# Patient Record
Sex: Female | Born: 1950 | Race: White | Hispanic: No | Marital: Married | Smoking: Never smoker
Health system: Southern US, Community
[De-identification: ages and names within clinical notes are randomized; demographics above are authoritative.]

## PROBLEM LIST (undated history)

## (undated) DIAGNOSIS — K219 Gastro-esophageal reflux disease without esophagitis: Secondary | ICD-10-CM

## (undated) DIAGNOSIS — F419 Anxiety disorder, unspecified: Secondary | ICD-10-CM

## (undated) DIAGNOSIS — E7800 Pure hypercholesterolemia, unspecified: Secondary | ICD-10-CM

## (undated) DIAGNOSIS — K589 Irritable bowel syndrome without diarrhea: Secondary | ICD-10-CM

## (undated) DIAGNOSIS — G4700 Insomnia, unspecified: Secondary | ICD-10-CM

## (undated) HISTORY — DX: Anxiety disorder, unspecified: F41.9

## (undated) HISTORY — PX: CHOLECYSTECTOMY: SHX55

## (undated) HISTORY — DX: Gastro-esophageal reflux disease without esophagitis: K21.9

## (undated) HISTORY — PX: CATARACT EXTRACTION: SUR2

## (undated) HISTORY — DX: Irritable bowel syndrome, unspecified: K58.9

## (undated) HISTORY — DX: Pure hypercholesterolemia, unspecified: E78.00

## (undated) HISTORY — DX: Insomnia, unspecified: G47.00

---

## 2017-12-13 ENCOUNTER — Encounter: Payer: Self-pay | Admitting: Neurology

## 2017-12-13 ENCOUNTER — Ambulatory Visit: Payer: BC Managed Care – PPO | Admitting: Neurology

## 2017-12-13 VITALS — BP 123/78 | HR 77 | Wt 115.5 lb

## 2017-12-13 DIAGNOSIS — R251 Tremor, unspecified: Secondary | ICD-10-CM

## 2017-12-13 DIAGNOSIS — G20 Parkinson's disease: Secondary | ICD-10-CM | POA: Diagnosis not present

## 2017-12-13 DIAGNOSIS — R29818 Other symptoms and signs involving the nervous system: Secondary | ICD-10-CM

## 2017-12-13 DIAGNOSIS — R29898 Other symptoms and signs involving the musculoskeletal system: Secondary | ICD-10-CM

## 2017-12-13 DIAGNOSIS — Z820 Family history of epilepsy and other diseases of the nervous system: Secondary | ICD-10-CM

## 2017-12-13 NOTE — Progress Notes (Signed)
Subjective:    Patient ID: Paula Fuller is a 67 y.o. female.  HPI     Paula Foley, MD, PhD [facility] [address] [address] [place], [place] [address]  Dear Paula Fuller,  I saw your patient, Paula Fuller, upon your kind request in my neurologic clinic today for initial consultation of her tremor. The patient is unaccompanied today. As you know, [name] is a 67 year old right-handed woman with an underlying medical history of anxiety, depression, irritable bowel syndrome, recent loss of weight, prior history of hyperlipidemia and reflux disease, who reports a right more than left hand tremor for the past 2 years, worse in the past few months. She has noticed other issues including fine motor dyscontrol, balance issues, mild short-term memory loss. 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 She has never considered seeing ENT for this. She has suboptimally controlled anxiety. She has been on Lexapro in the past which caused significant GI related side effects and for the past 2 months she has been on sertraline. She does not feel that the sertraline has been helpful. She has been on Xanax off and on since the late 90s when she had to take care of her father. She has been on Xanax consistently 3 times a day for the past couple of years, since 2016 or so. She is currently on sertraline 100 mg daily and alprazolam 0.5 mg 3 times a day. She has had significant weight loss and had multiple tests for this including endoscopy and CAT scan all per her report without significant abnormality is thankfully. She is using some protein milkshakes to supplement her diet. Her tremor is at rest as well as with activity at times. It is more noticeable on the right, she has also noticed difficulty with fine motor tasks also more on the right. She fell recently once in the yard, as she was standing up from the squatting position and thankfully did not hurt herself.I  reviewed your office note from 10/29/2017. She has no family history of tremors but reports a family history of Parkinson's disease, her father's first cousin apparently had Parkinson's disease, another first cousin of her father had some other neurological disease, maybe MS. She has worried about MS. She saw a neurologist in [place], [place] in August 2018 told her she does not have MS. She is a nonsmoker and does not drink alcohol and drinks caffeine one serving per day on average. She is married and lives with her husband, they have 1 daughter. 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  Her Past Medical History Is Significant For: Past Medical History:  Diagnosis Date  . Anxiety disorder   . GERD (gastroesophageal reflux disease)   . High cholesterol   . IBS (irritable bowel syndrome)   . Insomnia     Her Past Surgical History Is Significant For:   Her Family History Is Significant For: No family history on file.  Her Social History Is Significant For: Social History   Socioeconomic History  . Marital status: Married    Spouse name: Not on file  . Number of children: Not on file  . Years of education: Not on file  . Highest education level: Not on file  Occupational History  . Not on file  Social Needs  . Financial resource strain: Not on file  . Food insecurity:    Worry:  Not on file    Inability: Not on file  . Transportation needs:    Medical: Not on file    Non-medical: Not on file  Tobacco Use  . Smoking status: Never Smoker  . Smokeless tobacco: Never Used  Substance and Sexual Activity  . Alcohol use: Never    Frequency: Never  . Drug use: Never  . Sexual activity: Not on file  Lifestyle  . Physical activity:    Days per week: Not on file    Minutes per session: Not on file  . Stress: Not on file  Relationships  . Social connections:    Talks on phone: Not on file    Gets together: Not on  file    Attends religious service: Not on file    Active member of club or organization: Not on file    Attends meetings of clubs or organizations: Not on file    Relationship status: Not on file  Other Topics Concern  . Not on file  Social History Narrative  . Not on file    Her Allergies Are:  Not on File:   Her Current Medications Are:  Outpatient Encounter Medications as of 12/13/2017  Medication Sig  . ALPRAZolam (XANAX) 0.5 MG tablet Take 0.5 mg by mouth 3 (three) times daily.  . sertraline (ZOLOFT) 50 MG tablet Take 50 mg by mouth daily.  . [DISCONTINUED] escitalopram (LEXAPRO) 10 MG tablet Take 10 mg by mouth daily.  . [DISCONTINUED] estradiol (ESTRACE) 0.1 MG/GM vaginal cream Place 1 Applicatorful vaginally 3 (three) times a week.   No facility-administered encounter medications on file as of 12/13/2017.   : Review of Systems:  Out of a complete 14 point review of systems, all are reviewed and negative with the exception of these symptoms as listed below:  Review of Systems  Neurological:       Patient states that she has frequent headaches, tremors-mostly on her right side, and fatigue for about 2 years now.     Objective:  Neurological Exam  Physical Exam Physical Examination:   Vitals:   12/13/17 0942  BP: 123/78  Pulse: 77   General Examination: The patient is a very pleasant 67 y.o. female in no acute distress. She appears well-developed and well-nourished and well groomed. She is mildly anxious appearing.  HEENT: Normocephalic, atraumatic, pupils are equal, round and reactive to light and accommodation. She has corrective eyeglasses, extraocular tracking is fairly well-preserved but she does have very mild facial masking. She has an intermittent lower lip tremor. Airway examination shows benign findings with the exception of mild to moderate mouth dryness. Tongue protrudes centrally and palate elevates symmetrically. She has no telltale hypophonia, no  dysarthria, no voice tremor.  Chest: Clear to auscultation without wheezing, rhonchi or crackles noted.  Heart: S1+S2+0, regular and normal without murmurs, rubs or gallops noted.   Abdomen: Soft, non-tender and non-distended with normal bowel sounds appreciated on auscultation.  Extremities: There is no pitting edema in the distal lower extremities bilaterally. Pedal pulses are intact.  Skin: Warm and dry without trophic changes noted. There are no varicose veins.  Musculoskeletal: exam reveals no obvious joint deformities, tenderness or joint swelling or erythema.   Neurologically:  Mental status: The patient is awake, alert and oriented in all 4 spheres. Her immediate and remote memory, attention, language skills and fund of knowledge are appropriate. There is no evidence of aphasia, agnosia, apraxia or anomia. Speech is clear with normal prosody and enunciation. Thought  process is linear. Mood is normal and affect is normal.  Cranial nerves II - XII are as described above under HEENT exam. In addition: shoulder shrug is normal. Motor exam: Normal bulk, and strength are noted. She has mild increase in tone in the right upper extremity with slight cogwheeling noted. There is a mild intermittent resting tremor in the right hand.   On 12/13/2017: On Archimedes spiral drawing she has slight insecurity with both hands, no obvious tremor though. Handwriting is legible, not particularly tremulous, but mildly micrographic. She has a slight postural tremor in both upper extremities, she has no significant action tremor, no intention tremor. On fine motor testing with finger taps she has mild difficulty on the right, slight difficulty on the left, mild fatiguing/decrease in amplitude noted on the right, similarly with foot taps she has mild difficulty on the right, slight difficulty in the left. Rapid alternating patting is fairly well-preserved bilaterally.  Romberg is negative. Reflexes are 3+  throughout. Babinski: Toes are flexor bilaterally.  Cerebellar testing: No dysmetria or intention tremor on finger to nose testing. There is no truncal or gait ataxia.  Sensory exam: intact to light touch.  Gait, station and balance: She stands without difficulty, posture is age-appropriate,left shoulder perhaps slightly higher than right. She walks with a normal stride length and fairly good pace, slight decrease in arm swing noted on the right. She turns well, balance is preserved.   Assessment and Plan:   In summary, Tammatha Cobb is a very pleasant 67 year old female ith an underlying medical history of anxiety, depression, irritable bowel syndrome, recent loss of weight, prior history of hyperlipidemia and reflux disease, who presents for neurologic consultation of her tremor of approximately 2 years duration, right side more than left. In addition, she has noted other symptoms including fine motor dysfunction, changes in her balance. Her anxiety is suboptimally controlled. She is not sure that the sertraline has been helpful. She is not very open to the possibility of seeing a psychiatrist or psychologist but is encouraged to discuss this with you. 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 She does have some complicating factors in that she is taking a medication that can exacerbate tremors, she is quite anxious which can increase tremor. She gives a remote family history of parkinsonism or Parkinson's disease in a distant uncle. I did not suggest any new medications at this time and would like to follow her clinically. I would like to proceed with a brain MRI at this point. I talked her about potentially pursuing a DaT scan for help with diagnosing her tremor disorder. We may Consider symptomatic medication soon.we talked about the importance of a healthy lifestyle in general. I  encouraged the patient to eat healthy, exercise daily and keep well hydrated, to keep a scheduled bedtime and wake time routine, to not skip any meals and eat healthy snacks in between meals and to have protein with every meal. In particular, I stressed the importance of regular exercise, within of course the patient's own mobility limitations. I answered all her questions today and the patient was in agreement with the above outlined plan. I would like to see the patient back in 3-4 months, sooner if needed, we will keep you posted as to her MRI results in the interim. Thank you very much for allowing me to participate in the care of this nice patient. If I can be of any further  assistance to you please do not hesitate to call me at [phone].  Sincerely,   Star Age, MD, PhD

## 2017-12-13 NOTE — Progress Notes (Deleted)
Subjective:    Patient ID: Paula Fuller is a 67 y.o. female.  HPI     Huston Foley, MD, PhD [facility] [address], [address] [address] [place], [place] [address]  Dear Paula Fuller,  I saw your patient, Paula Fuller, upon your kind request in my neurologic clinic today for initial consultation of her tremor. The patient is unaccompanied today. As you know, Paula Fuller is a 67 year old right-handed woman with an underlying medical history of anxiety, depression, irritable bowel syndrome, recent loss of weight, prior history of hyperlipidemia and reflux disease, who reports a right more than left hand tremor for the past 2 years, worse in the past few months. She has noticed other issues including fine motor dyscontrol, balance issues, mild short-term memory loss. 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 She has never considered seeing ENT for this. She has suboptimally controlled anxiety. She has been on Lexapro in the past which caused significant GI related side effects and for the past 2 months she has been on sertraline. She does not feel that the sertraline has been helpful. She has been on Xanax off and on since the late 90s when she had to take care of her father. She has been on Xanax consistently 3 times a day for the past couple of years, since 2016 or so. She is currently on sertraline 100 mg daily and alprazolam 0.5 mg 3 times a day. She has had significant weight loss and had multiple tests for this including endoscopy and CAT scan all per her report without significant abnormality is thankfully. She is using some protein milkshakes to supplement her diet. Her tremor is at rest as well as with activity at times. It is more noticeable on the right, she has also noticed difficulty with fine motor tasks also more on the right. She fell recently once in the yard, as she was standing up from the squatting position and thankfully did not hurt herself.I  reviewed your office note from 10/29/2017. She has no family history of tremors but reports a family history of Parkinson's disease, her father's first cousin apparently had Parkinson's disease, another first cousin of her father had some other neurological disease, maybe MS. She has worried about MS. She saw a neurologist in [place], [place] in August 2018 told her she does not have MS. She is a nonsmoker and does not drink alcohol and drinks caffeine one serving per day on average. She is married and lives with her husband, they have 1 daughter. 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  Her Past Medical History Is Significant For: Past Medical History:  Diagnosis Date  . Anxiety disorder   . GERD (gastroesophageal reflux disease)   . High cholesterol   . IBS (irritable bowel syndrome)   . Insomnia     Her Past Surgical History Is Significant For:   Her Family History Is Significant For: No family history on file.  Her Social History Is Significant For: Social History   Socioeconomic History  . Marital status: Married    Spouse name: Not on file  . Number of children: Not on file  . Years of education: Not on file  . Highest education level: Not on file  Occupational History  . Not on file  Social Needs  . Financial resource strain: Not on file  . Food insecurity:    Worry:  Not on file    Inability: Not on file  . Transportation needs:    Medical: Not on file    Non-medical: Not on file  Tobacco Use  . Smoking status: Never Smoker  . Smokeless tobacco: Never Used  Substance and Sexual Activity  . Alcohol use: Never    Frequency: Never  . Drug use: Never  . Sexual activity: Not on file  Lifestyle  . Physical activity:    Days per week: Not on file    Minutes per session: Not on file  . Stress: Not on file  Relationships  . Social connections:    Talks on phone: Not on file    Gets together: Not on  file    Attends religious service: Not on file    Active member of club or organization: Not on file    Attends meetings of clubs or organizations: Not on file    Relationship status: Not on file  Other Topics Concern  . Not on file  Social History Narrative  . Not on file    Her Allergies Are:  Not on File:   Her Current Medications Are:  Outpatient Encounter Medications as of 12/13/2017  Medication Sig  . ALPRAZolam (XANAX) 0.5 MG tablet Take 0.5 mg by mouth 3 (three) times daily.  . sertraline (ZOLOFT) 50 MG tablet Take 50 mg by mouth daily.  . [DISCONTINUED] escitalopram (LEXAPRO) 10 MG tablet Take 10 mg by mouth daily.  . [DISCONTINUED] estradiol (ESTRACE) 0.1 MG/GM vaginal cream Place 1 Applicatorful vaginally 3 (three) times a week.   No facility-administered encounter medications on file as of 12/13/2017.   : Review of Systems:  Out of a complete 14 point review of systems, all are reviewed and negative with the exception of these symptoms as listed below:  Review of Systems  Neurological:       Patient states that she has frequent headaches, tremors-mostly on her right side, and fatigue for about 2 years now.     Objective:  Neurological Exam  Physical Exam Physical Examination:   Vitals:   12/13/17 0942  BP: 123/78  Pulse: 77   General Examination: The patient is a very pleasant 67 y.o. female in no acute distress. She appears well-developed and well-nourished and well groomed. She is mildly anxious appearing.  HEENT: Normocephalic, atraumatic, pupils are equal, round and reactive to light and accommodation. She has corrective eyeglasses, extraocular tracking is fairly well-preserved but she does have very mild facial masking. She has an intermittent lower lip tremor. Airway examination shows benign findings with the exception of mild to moderate mouth dryness. Tongue protrudes centrally and palate elevates symmetrically. She has no telltale hypophonia, no  dysarthria, no voice tremor.  Chest: Clear to auscultation without wheezing, rhonchi or crackles noted.  Heart: S1+S2+0, regular and normal without murmurs, rubs or gallops noted.   Abdomen: Soft, non-tender and non-distended with normal bowel sounds appreciated on auscultation.  Extremities: There is no pitting edema in the distal lower extremities bilaterally. Pedal pulses are intact.  Skin: Warm and dry without trophic changes noted. There are no varicose veins.  Musculoskeletal: exam reveals no obvious joint deformities, tenderness or joint swelling or erythema.   Neurologically:  Mental status: The patient is awake, alert and oriented in all 4 spheres. Her immediate and remote memory, attention, language skills and fund of knowledge are appropriate. There is no evidence of aphasia, agnosia, apraxia or anomia. Speech is clear with normal prosody and enunciation. Thought  process is linear. Mood is normal and affect is normal.  Cranial nerves II - XII are as described above under HEENT exam. In addition: shoulder shrug is normal. Motor exam: Normal bulk, and strength are noted. She has mild increase in tone in the right upper extremity with slight cogwheeling noted. There is a mild intermittent resting tremor in the right hand.   On 12/13/2017: On Archimedes spiral drawing she has slight insecurity with both hands, no obvious tremor though. Handwriting is legible, not particularly tremulous, but mildly micrographic. She has a slight postural tremor in both upper extremities, she has no significant action tremor, no intention tremor. On fine motor testing with finger taps she has mild difficulty on the right, slight difficulty on the left, mild fatiguing/decrease in amplitude noted on the right, similarly with foot taps she has mild difficulty on the right, slight difficulty in the left. Rapid alternating patting is fairly well-preserved bilaterally.  Romberg is negative. Reflexes are 3+  throughout. Babinski: Toes are flexor bilaterally.  Cerebellar testing: No dysmetria or intention tremor on finger to nose testing. There is no truncal or gait ataxia.  Sensory exam: intact to light touch.  Gait, station and balance: She stands without difficulty, posture is age-appropriate,left shoulder perhaps slightly higher than right. She walks with a normal stride length and fairly good pace, slight decrease in arm swing noted on the right. She turns well, balance is preserved.   Assessment and Plan:   Assessment and Plan:  In summary, Paula Fuller is a very pleasant 67 y.o.-year old female ith an underlying medical history of anxiety, depression, irritable bowel syndrome, recent loss of weight, prior history of hyperlipidemia and reflux disease, who presents for neurologic consultation of her tremor of approximately 2 years duration, right side more than left. In addition, she has noted other symptoms including fine motor dysfunction, changes in her balance. Her anxiety is suboptimally controlled. She is not sure that the sertraline has been helpful. She is not very open to the possibility of seeing a psychiatrist or psychologist but is encouraged to discuss this with you. 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 She does have some complicating factors in that she is taking a medication that can exacerbate tremors, she is quite anxious which can increase tremor. She gives a remote family history of parkinsonism or Parkinson's disease in a distant uncle. I did not suggest any new medications at this time and would like to follow her clinically. I would like to proceed with a brain MRI at this point. I talked her about potentially pursuing a DaT scan for help with diagnosing her tremor disorder. We may Consider symptomatic medication soon.we talked about the importance of a healthy  lifestyle in general. I encouraged the patient to eat healthy, exercise daily and keep well hydrated, to keep a scheduled bedtime and wake time routine, to not skip any meals and eat healthy snacks in between meals and to have protein with every meal. In particular, I stressed the importance of regular exercise, within of course the patient's own mobility limitations. I answered all her questions today and the patient was in agreement with the above outlined plan. I would like to see the patient back in 3-4 months, sooner if needed, we will keep you posted as to her MRI results in the interim. Thank you very much for allowing me to participate in the care of this nice patient. If I can  be of any further assistance to you please do not hesitate to call me at [phone].  Sincerely,   Star Age, MD, PhD

## 2017-12-13 NOTE — Patient Instructions (Signed)
I think you have signs and symptoms of mild parkinsonism, possibly right sided Parkinson's disease. Your situation is complicated by your suboptimally treated anxiety and taking medication that can also exacerbate or cause tremors, the sertraline.   Please continue with your current medication regimen. Please talk to your primary care provider about anxiety management and the possibility of seeing a specialist such as psychiatry or counselor.  Remember to drink plenty of fluid at least 6 glasses (8 oz each), eat healthy meals and do not skip any meals. Try to eat protein with a every meal and eat a healthy snack such as fruit or nuts in between meals. Try to keep a regular sleep-wake schedule and try to exercise daily, particularly in the form of walking, 20-30 minutes a day, if you can.   Try to stay active physically and mentally. Engage in social activities in your community and with your family and try to keep up with current events by reading the newspaper or watching the news. Try to do word puzzles and you may like to do puzzles and brain games on the computer such as on [email or web address]   As far as diagnostic testing, I will order a brain MRI and call you with the test results. We will have to schedule you for this on a separate date. This test requires authorization from your insurance, and we will take care of the insurance process.   We may also do another scan, called DaT scan: This is a specialized brain scan designed to help with diagnosis of tremor disorders. A radioactive marker gets injected and the uptake is measured in the brain and compared to normal controls and right side is compared to the left, a change in uptake can help with diagnosis of certain tremor disorders. A brain MRI on the other hand is a brain scan that helps look at the brain structure in more detail overall and look for age-related changes, blood vessel related changes and look for stroke and volume loss which we call  atrophy.   I would like to see you back in 4 months, sooner if we need to.

## 2017-12-17 ENCOUNTER — Telehealth: Payer: Self-pay | Admitting: Neurology

## 2017-12-17 NOTE — Telephone Encounter (Signed)
[employer] Auth: [id number] (exp. 12/17/17 to 01/15/18) pt requested GI order sent to GI. They will contact the pt to schedule.

## 2017-12-27 ENCOUNTER — Ambulatory Visit
Admission: RE | Admit: 2017-12-27 | Discharge: 2017-12-27 | Disposition: A | Discharge status: Home / Self Care | Payer: BC Managed Care – PPO | Source: Ambulatory Visit | Attending: Neurology | Admitting: Neurology

## 2017-12-27 DIAGNOSIS — G20 Parkinson's disease: Secondary | ICD-10-CM

## 2017-12-27 MED ORDER — GADOBENATE DIMEGLUMINE 529 MG/ML IV SOLN
10.0000 mL | Freq: Once | INTRAVENOUS | Status: AC | PRN
  Administered 2017-12-27: 10 mL via INTRAVENOUS

## 2017-12-28 NOTE — Progress Notes (Signed)
MRI brain shows age-appropriate findings. Please notify patient.  Huston FoleySaima Yerik Zeringue, MD, PhD [facility]

## 2017-12-31 ENCOUNTER — Telehealth: Payer: Self-pay

## 2017-12-31 NOTE — Telephone Encounter (Signed)
I called pt to discuss her MRI results. No answer, left a message asking her to call me back. 

## 2017-12-31 NOTE — Telephone Encounter (Signed)
Pt returned my call. I explained that her MRI results revealed age-appropriate findings. I reminded pt of her appt in September. Pt verbalized understanding of results. Pt had no questions at this time but was encouraged to call back if questions arise.

## 2017-12-31 NOTE — Telephone Encounter (Signed)
-----   Message from Huston FoleySaima Athar, MD sent at 12/28/2017 12:49 PM EDT ----- MRI brain shows age-appropriate findings. Please notify patient.  Huston FoleySaima Athar, MD, PhD [facility])

## 2018-01-10 ENCOUNTER — Telehealth: Payer: Self-pay | Admitting: Neurology

## 2018-01-10 DIAGNOSIS — G20 Parkinson's disease: Secondary | ICD-10-CM

## 2018-01-10 NOTE — Telephone Encounter (Signed)
Pt requesting a call to discuss if Dr. Frances FurbishAthar is still wanting the pt to have a Dat scan.

## 2018-01-11 NOTE — Telephone Encounter (Signed)
I called pt. She wants to know if the DAT scan "is still on the table." I advised her that Dr. Frances FurbishAthar has not yet ordered the DAT Scan, this can be discussed at her appt in September. Pt voiced concerns that the Septmeber appt is a couple months away. She wants to know if Dr. Frances FurbishAthar will go ahead and order the DAT scan before the appt in September. I advised pt that Dr. Frances FurbishAthar is currently out of the office but when she returns, I can discuss this further with her. Pt verbalized understanding.

## 2018-01-21 NOTE — Addendum Note (Signed)
Addended by: Huston FoleyATHAR, Penelopi Mikrut on: 01/21/2018 10:57 AM   Modules accepted: Orders

## 2018-01-21 NOTE — Telephone Encounter (Signed)
DAT scan order placed. She may have to stop by to give consent and sign paperwork?

## 2018-01-21 NOTE — Telephone Encounter (Signed)
I called pt, explained that Dr. Frances FurbishAthar has ordered a DAT scan, but that she may need to come in to sign consent paperwork. Pt has many questions about the DAT scan, including where it can be performed and if the consent form can be mailed to her since she lives an hour away. I advised her that I will ask Annabelle HarmanDana to call her to discuss. Pt verbalized understanding.

## 2018-01-22 NOTE — Telephone Encounter (Signed)
Noted  

## 2018-01-22 NOTE — Telephone Encounter (Signed)
Called and explained the process to patient she is going to mail the the consent back to me / Baxter HireKristen . Baxter HireKristen if some reason you get please give to me because I can't submit to insurance unless I have consent.  I explained this to patient as well.

## 2018-01-28 ENCOUNTER — Telehealth: Payer: Self-pay | Admitting: Neurology

## 2018-01-28 NOTE — Telephone Encounter (Signed)
Mailed DAT Scan Consent x 2 to Patient . I asked Patient to please call me when she receives DAT Scan Consent.

## 2018-01-28 NOTE — Telephone Encounter (Signed)
Pt requesting a call in regarding to the forms Annabelle HarmanDana had mailed her. Stating she hadn't received them yet and would like to make sure they are on the way, or the address had been entered correctly. Please call to advise

## 2018-01-31 NOTE — Telephone Encounter (Signed)
Pt has called back to inform Annabelle HarmanDana that she has yet to receive the DAT Scan Consent Pt is [facility]

## 2018-01-31 NOTE — Telephone Encounter (Signed)
I called the patient and let her know that Annabelle HarmanDana had sent the letter and it should be arriving within the next few days, she was very appreciative.

## 2018-02-05 NOTE — Telephone Encounter (Signed)
Paper Work in [employer] for DAT [employer] approval . I have called and spoke to patient she is aware.

## 2018-02-26 ENCOUNTER — Telehealth: Payer: Self-pay | Admitting: Neurology

## 2018-02-26 NOTE — Telephone Encounter (Signed)
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 Furthermore, she gives a family history of parkinsonism or Parkinson's disease. A DaT scan can help with narrowing down the diagnosis for tremor disorders, especially when the situation is complicated by multiple factors.

## 2018-02-26 NOTE — Telephone Encounter (Signed)
Dr. Frances FurbishAthar , Will you please type a short letter to [facility] stating why you think patient would benefit from a DAT Scan Per [facility] Request.

## 2018-02-27 NOTE — Telephone Encounter (Signed)
I have scanned and sent [facility] [employer].

## 2018-02-27 NOTE — Telephone Encounter (Signed)
Letter written, signed by Dr. Frances FurbishAthar, and [facility] to [facility].

## 2018-02-27 NOTE — Telephone Encounter (Signed)
Patient is scheduled For 03/21/2018. Thanks Annabelle Harmanana . Patient is aware of details.

## 2018-03-19 NOTE — Telephone Encounter (Signed)
Pt called she is wanting to move up f/u appt on 9/30. Pt thought she would have to wait until 9/30 to get DAT scan results. I reassured her she would be called as soon as the results are available to Dr Frances Furbish. Pt is still requesting to be seen sooner. There is not any availability on the schedule to move it up. Please call to advise

## 2018-03-19 NOTE — Telephone Encounter (Signed)
I called pt. She reports that her symptoms have worsened. Her tremors and balance are worse. Pt recently saw an eye doctor in [place], [place] and it was noted that one of pt's eyes was significantly worse than the right. I asked that pt call this eye doctor and have this report sent to Dr. Frances Furbish for review.  I advised pt that we do not have any openings currently but when I call her for the DAT scan results next week, we can discuss a sooner appt at that time. Pt verbalized understanding.

## 2018-03-21 ENCOUNTER — Encounter (HOSPITAL_COMMUNITY)
Admission: RE | Admit: 2018-03-21 | Discharge: 2018-03-21 | Disposition: A | Discharge status: Home / Self Care | Payer: BC Managed Care – PPO | Source: Ambulatory Visit | Attending: Neurology | Admitting: Neurology

## 2018-03-21 DIAGNOSIS — G20 Parkinson's disease: Secondary | ICD-10-CM | POA: Diagnosis not present

## 2018-03-21 MED ORDER — IOFLUPANE I 123 185 MBQ/2.5ML IV SOLN
4.7000 | Freq: Once | INTRAVENOUS | Status: AC
  Administered 2018-03-21: 4.7 via INTRAVENOUS

## 2018-03-21 MED ORDER — IODINE STRONG (LUGOLS) 5 % PO SOLN
0.8000 mL | Freq: Once | ORAL | Status: AC
  Administered 2018-03-21: 0.8 mL via ORAL
  Filled 2018-03-21: qty 0.8

## 2018-03-21 NOTE — Progress Notes (Signed)
Please call patient about the recent DaT scan: This is a specialized brain scan designed to help with diagnosis of tremor disorders. A radioactive marker gets injected and the uptake is measured in the brain and compared to normal controls and right side is compared to the left, a change in uptake can help with diagnosis of certain tremor disorders. Her scan indicated abnormal (as in lower) uptake as compared to normal uptake pattern indicating a parkinsonian disorder, which can include parkinson's disease.   We will follow up as scheduled.  Huston Foley, MD, PhD [facility])

## 2018-03-22 ENCOUNTER — Telehealth: Payer: Self-pay | Admitting: *Deleted

## 2018-03-22 NOTE — Telephone Encounter (Signed)
Called and spoke with patient about results per Dr. Frances Furbish note. Advised Dr. Frances Furbish can go over this in detail with her when she comes for her f/u. She verbalized understanding. I scheduled her for a sooner appt on 03/27/18 at 1130am, check in 11am. Dr. Frances Furbish had a cx. I cancelled her appt on 04/15/18 since she will be coming in sooner.

## 2018-03-22 NOTE — Telephone Encounter (Signed)
-----   Message from Huston Foley, MD sent at 03/21/2018  5:23 PM EDT ----- Please call patient about the recent DaT scan: This is a specialized brain scan designed to help with diagnosis of tremor disorders. A radioactive marker gets injected and the uptake is measured in the brain and compared to normal controls and right side is compared to the left, a change in uptake can help with diagnosis of certain tremor disorders. Her scan indicated abnormal (as in lower) uptake as compared to normal uptake pattern indicating a parkinsonian disorder, which can include parkinson's disease.   We will follow up as scheduled.  Huston Foley, MD, PhD [facility])

## 2018-03-27 ENCOUNTER — Ambulatory Visit: Payer: BC Managed Care – PPO | Admitting: Neurology

## 2018-03-27 ENCOUNTER — Encounter: Payer: Self-pay | Admitting: Neurology

## 2018-03-27 VITALS — BP 134/89 | HR 82 | Ht 62.0 in | Wt 117.0 lb

## 2018-03-27 DIAGNOSIS — G20 Parkinson's disease: Secondary | ICD-10-CM | POA: Diagnosis not present

## 2018-03-27 MED ORDER — PRAMIPEXOLE DIHYDROCHLORIDE 0.125 MG PO TABS: ORAL_TABLET | ORAL | 5 refills | Status: DC

## 2018-03-27 NOTE — Patient Instructions (Signed)
1. Start the Trintellix 5 mg once a day in the morning as per Dr. Leary Roca.   2. After about one month, you can start the Mirapex (generic name: pramipexole) 0.125 mg: Take 1 pill once daily for 3 days, then 1 pill twice daily for 3 days, then 1 pill three times a day thereafter. Common side effects reported are: Sedation, sleepiness, nausea, vomiting, and rare side effects are confusion, hallucinations, swelling in legs, and abnormal behaviors, including impulse control problems, which can manifest as excessive eating, obsessions with food or gambling, or hypersexuality.  3. Please try to cut back on your Xanax, gradually.   4. You can try Melatonin at night for sleep: take 1 mg to 3 mg, one to 2 hours before your bedtime. You can go up to 5 mg if needed. It is over the counter and comes in pill form, chewable form and spray, if you prefer.    5. Please continue to stay active, it is imperative that you stay physically, mentally, and socially.   6. Please be really proactive with your constipation medication regimen, titrating as needed to where you have a formed stool at least every other day.

## 2018-03-27 NOTE — Progress Notes (Signed)
Subjective:    Patient ID: Paula Fuller is a 67 y.o. female.  HPI     Interim history:   Paula Fuller is a 67 year old right-handed woman with an underlying medical history of anxiety, depression, irritable bowel syndrome, recent loss of weight, prior history of hyperlipidemia and reflux disease, who presents for follow-up consultation of her parkinsonism. The patient is accompanied by her husband today. I first met her on 12/13/2017 at the request of her primary care provider, at which time she reported a bilateral hand tremor of at least 2 years duration as well as fine motor dyscontrol. Right-sided symptoms were worse on the left. Her examination was concerning for parkinsonism with right-sided lateralization. I suggested we proceed with a brain MRI and we subsequently also proceeded with a DaT scan.  She had a brain MRI with and without contrast on 12/27/2017 and I reviewed the results: IMPRESSION: This MRI of the brain with and without contrast shows the following: 1.    There are some scattered T2/FLAIR hyperintense foci consistent with mild age-related chronic microvascular ischemic changes 2.    There are no acute findings and there is a normal enhancement pattern.   We called her with her test results.  She had a nuclear medicine DaT scan on 03/21/2018 and a very tight results: IMPRESSION: Near absent radiotracer activity within the bilateral posterior striata (putamen) in a pattern typical of a Parkinson's syndrome. pathology. There is mild decreased relative activity in the head of the LEFT caudate nucleus compared to the RIGHT.  We called her with her test results and scheduled her for a sooner than scheduled appointment.   Today, 03/27/2018: She reports no new motor symptoms but does struggle with her anxiety and depression symptoms. She has loss of motivation and does not feel like doing much in the way of physical or social activities. She did not used to be like this  particularly before her retirement she was very active. She is still doing tutoring and substitute teaching. Nevertheless, she struggles with having to make herself to things. She has tried multiple different antidepressants in the past including Lexapro and sertraline. She has most recently been given a sample for trintellix by her PCP but has not started it yet. She also has a prescription for the lowest strength, 5 mg. She still takes and next half a pill during the day and 2 at night. Sometimes she takes to half pills during the day. She tried to go without it but does have difficulty sleeping at night. She worries a lot. She has additional stressors regarding her daughter's medical history and also worries about her 62-year-old granddaughter.   The patient's allergies, current medications, family history, past medical history, past social history, past surgical history and problem list were reviewed and updated as appropriate.   Previously:  12/13/2017: (She) reports a right more than left hand tremor for the past 2 years, worse in the past few months. She has noticed other issues including fine motor dyscontrol, balance issues, mild short-term memory loss. 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 She has never considered seeing ENT for this. She has suboptimally controlled anxiety. She has been on Lexapro in the past which caused significant GI related side effects and for the past 2 months she has been on sertraline. She does not feel that the sertraline has been helpful. She has been on Xanax off and on since the late 90s when she had to take  care of her father. She has been on Xanax consistently 3 times a day for the past couple of years, since 2016 or so. She is currently on sertraline 100 mg daily and alprazolam 0.5 mg 3 times a day. She has had significant weight loss and had multiple tests for this including endoscopy and CAT scan all per her report without significant  abnormality is thankfully. She is using some protein milkshakes to supplement her diet. Her tremor is at rest as well as with activity at times. It is more noticeable on the right, she has also noticed difficulty with fine motor tasks also more on the right. She fell recently once in the yard, as she was standing up from the squatting position and thankfully did not hurt herself.I reviewed your office note from 10/29/2017. She has no family history of tremors but reports a family history of Parkinson's disease, her father's first cousin apparently had Parkinson's disease, another first cousin of her father had some other neurological disease, maybe MS. She has worried about MS. She saw a neurologist in [place], [place] in August 2018 told her she does not have MS. She is a nonsmoker and does not drink alcohol and drinks caffeine one serving per day on average. She is married and lives with her husband, they have 1 daughter. 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  Her Past Medical History Is Significant For: Past Medical History:  Diagnosis Date  . Anxiety disorder   . GERD (gastroesophageal reflux disease)   . High cholesterol   . IBS (irritable bowel syndrome)   . Insomnia     Her Past Surgical History Is Significant For:   Her Family History Is Significant For: No family history on file.  Her Social History Is Significant For: Social History   Socioeconomic History  . Marital status: Married    Spouse name: Not on file  . Number of children: Not on file  . Years of education: Not on file  . Highest education level: Not on file  Occupational History  . Not on file  Social Needs  . Financial resource strain: Not on file  . Food insecurity:    Worry: Not on file    Inability: Not on file  . Transportation needs:    Medical: Not on file    Non-medical: Not on file  Tobacco Use  . Smoking status: Never Smoker  .  Smokeless tobacco: Never Used  Substance and Sexual Activity  . Alcohol use: Never    Frequency: Never  . Drug use: Never  . Sexual activity: Not on file  Lifestyle  . Physical activity:    Days per week: Not on file    Minutes per session: Not on file  . Stress: Not on file  Relationships  . Social connections:    Talks on phone: Not on file    Gets together: Not on file    Attends religious service: Not on file    Active member of club or organization: Not on file    Attends meetings of clubs or organizations: Not on file    Relationship status: Not on file  Other Topics Concern  . Not on file  Social History Narrative  . Not on file    Her Allergies Are:  No Known Allergies:   Her Current Medications Are:  Outpatient Encounter Medications as of 03/27/2018  Medication Sig  . ALPRAZolam Duanne Moron)  0.5 MG tablet Take 0.5 mg by mouth. 1/2 tablet in AM, 1/2 tablet in PM, and 2 tablets at bedtime.  . [DISCONTINUED] sertraline (ZOLOFT) 50 MG tablet Take 50 mg by mouth daily.   No facility-administered encounter medications on file as of 03/27/2018.   :  Review of Systems:  Out of a complete 14 point review of systems, all are reviewed and negative with the exception of these symptoms as listed below: Review of Systems  Neurological:       Pt presents today to discuss her DAT scan results.    Objective:  Neurological Exam  Physical Exam Physical Examination:   Vitals:   03/27/18 1108  BP: 134/89  Pulse: 82    General Examination: The patient is a very pleasant 67 y.o. female in no acute distress. She appears well-developed and well-nourished and well groomed.   HEENT: Normocephalic, atraumatic, pupils are equal, round and reactive to light and accommodation. She has corrective eyeglasses, extraocular tracking is fairly well-preserved , mild facial masking is noted. No significant lip or jaw tremor is noted today. Airway examination is stable. She has minimal  hypophonia, no dysarthria, mild nuchal rigidity.  Chest: Clear to auscultation without wheezing, rhonchi or crackles noted.  Heart: S1+S2+0, regular and normal without murmurs, rubs or gallops noted.   Abdomen: Soft, non-tender and non-distended with normal bowel sounds appreciated on auscultation.  Extremities: There is no pitting edema in the distal lower extremities bilaterally.   Skin: Warm and dry without trophic changes noted.   Musculoskeletal: exam reveals no obvious joint deformities, tenderness or joint swelling or erythema.   Neurologically:  Mental status: The patient is awake, alert and oriented in all 4 spheres. Her immediate and remote memory, attention, language skills and fund of knowledge are appropriate. There is no evidence of aphasia, agnosia, apraxia or anomia with some slowness in thinking at times, Speech is clear with normal prosody and enunciation. Thought process is linear. Mood is normal and affect is bluntedis as well.   Cranial nerves II - XII are as described above under HEENT exam. In addition: shoulder shrug is normal. Motor exam: Normal bulk, and strength are noted. She has mild increase in tone in the right upper extremity with slight cogwheeling noted. There is a mild resting tremor in the right hand.   (On 12/13/2017: On Archimedes spiral drawing she has slight insecurity with both hands, no obvious tremor though. Handwriting is legible, not particularly tremulous, but mildly micrographic.)   She has a slight postural tremor in both upper extremities, she has no significant action tremor, no intention tremor. On fine motor testing with finger taps she has mild difficulty on the right, slight difficulty on the left, mild fatiguing/decrease in amplitude noted on the right, similarly with foot taps she has mild difficulty on the right, slight difficulty in the left. Rapid alternating patting is fairly well-preserved bilaterally.  Romberg is negative.  Reflexes are 3+ throughout.   Cerebellar testing: No dysmetria or intention tremor. There is no truncal or gait ataxia.  Sensory exam: intact to light touch.  Gait, station and balance: She stands without difficulty, posture is age-appropriate, left shoulder perhaps slightly higher than right. She walks with a normal stride length and fairly good pace, slight decrease in arm swing noted on the right. She turns well, balance is preserved. Stable.   Assessment and Plan:   In summary, Kamarii Buren is a very pleasant 67 year old female ith an underlying medical history of anxiety,  depression, irritable bowel syndrome, weight loss, prior history of hyperlipidemia and reflux disease, who presents for FU neurologic consultation of hertremor disorder with symptoms dating back to about 2 years ago with right hand tremor more than left. 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 We did an interim brain MRI which showed age-appropriate findings in June 2019. She also had a DaT scan earlier in September 2019 which confirmed findings in the realm of parkinsonism d/o.  Her symptoms are significantly overshadowed by her suboptimally treated anxiety and depression. She is encouraged to start her antidepressant that she recently received from her primary care physician. After about a month she is advised to start a trial of low-dose Mirapex a dopamine agonist. I talked to the patient and her husband at length about her findings, diagnostic possibility, different medication options and overall prognosis. I again stressed the importance of a healthy lifestyle in general. I encouraged the patient to eat healthy, exercise daily and keep well hydrated, to keep a scheduled bedtime and wake time routine, to not skip any meals and eat healthy snacks in between meals and to have protein with every meal. In particular, I stressed the importance of regular  exercise, within of course the patient's own mobility limitations. She is advised to be proactive about constipation issues. She is advised that it is imperative that she exercise on a regular basis and keep socially and mentally very active. We will start Mirapex 0.125 mg strength one pill once daily with gradual increase to only one pill 3 times a day for now. She does have a history of diarrhea and/or constipation and also nausea what with her irritable bowel syndrome. We will be cautious with medication trials for that reason. I suggested that she try to taper down her Xanax. For nighttime sleep I suggested she try over-the-counter melatonin. She Has sleep disruption and rumination at night. She has more anxiety at night often. She does not have a telltale history of REM behavior disorder although she has talked in her sleep per husband's report. I suggested a 3 month follow-up, sooner if needed. She is advised to call after about a month after starting Mirapex so we can consider increasing the dose prior to her next appointment if possible. I answered all their questions today and the patient and her husband were in agreement with the plan. I spent 45 minutes in total face-to-face time with the patient, more than 50% of which was spent in counseling and coordination of care, reviewing test results, reviewing medication and discussing or reviewing the diagnosis of PD, its prognosis and treatment options. Pertinent laboratory and imaging test results that were available during this visit with the patient were reviewed by me and considered in my medical decision making (see chart for details).

## 2018-04-04 ENCOUNTER — Telehealth: Payer: Self-pay | Admitting: Neurology

## 2018-04-04 NOTE — Telephone Encounter (Signed)
She can go ahead and start the Mirapex. I would recommend she follow-up with primary care regarding antidepressant medication and management of depression and anxiety and how to taper Xanax.

## 2018-04-04 NOTE — Telephone Encounter (Signed)
I called pt. She reports that she took the trintellix as prescribed by her PCP for about 5-6 days but could not tolerate it. She has having panic attacks and it made her feel worse. She stopped the trintellix on Tuesday.  Pt has cut back on her xanax; she is now taking 1/2 tablet in the AM, 1/2 tablet in the PM, and 1 tablet QHS with her melatonin.  Pt wants to know if Dr. Frances FurbishAthar recommends anything else for her anxiety since the trintellix did not work out. Pt also wants to know if she can go ahead and start the mirapex. Pt also wants to know if she can continue taking her xanax as she is now and not cut it back any further until she finds another anti-depressant.

## 2018-04-04 NOTE — Telephone Encounter (Signed)
Pt is asking for a call from RN Baxter HireKristen to discuss issues she is having with medications.  Pt chose not to go into further detail regarding the reason for her requesting a call

## 2018-04-04 NOTE — Telephone Encounter (Signed)
I called pt and advised her of Dr. Teofilo PodAthar's recommendations. She will speak to her PCP regarding the xanax and the anti-depressant. Pt verbalized understanding of recommendations.

## 2018-04-15 ENCOUNTER — Ambulatory Visit: Payer: BC Managed Care – PPO | Admitting: Neurology

## 2018-04-15 ENCOUNTER — Encounter

## 2018-06-10 ENCOUNTER — Telehealth: Payer: Self-pay | Admitting: Neurology

## 2018-06-10 MED ORDER — PRAMIPEXOLE DIHYDROCHLORIDE 0.25 MG PO TABS: ORAL_TABLET | ORAL | 3 refills | Status: DC

## 2018-06-10 NOTE — Telephone Encounter (Signed)
I called pt. She is taking mirapex TID. She says that her mood and appetite have improved and pt has actually gained 7 lbs. Pt has tapered down to xanax 0.25mg  daily prn and is not taking it every day. She is taking lunesta 2mg  QHS. Pt's tremors are stable and she doesn't feel that the mirapex has helped. She is wondering if Dr. Frances FurbishAthar will add something for her tremors. Pt reports that Dr. Frances FurbishAthar mentioned starting another tremor medication if the mirapex wasn't helping her tremor.

## 2018-06-10 NOTE — Telephone Encounter (Signed)
Pt has called to inform that the pramipexole (MIRAPEX) 0.125 MG tablet has helped with mood and appetite but not tremors.  Pt asking that something be added to help with tremors(as Dr Frances FurbishAthar said she would be willing to do)

## 2018-06-10 NOTE — Addendum Note (Signed)
Addended by: Huston FoleyATHAR, Jawara Latorre on: 06/10/2018 05:46 PM   Modules accepted: Orders

## 2018-06-10 NOTE — Addendum Note (Signed)
Addended by: Geronimo RunningINKINS, Dilan Novosad A on: 06/10/2018 10:23 AM   Modules accepted: Orders

## 2018-06-10 NOTE — Telephone Encounter (Signed)
She is on a small dose of Mirapex only. As she is tolerating it, I would like to increase it to the next dose, 0.25 mg strength one pill 3 times a day for 2 weeks then 2 pills 3 times a day thereafter. Rx done, please advise pt.

## 2018-06-11 NOTE — Telephone Encounter (Signed)
Pt returned my call. I explained Dr.Athar's recommendations. Pt is agreeable to this increase in mirapex and verbalized understanding in directions.. Pt will follow up on 06/26/18 as scheduled.

## 2018-06-11 NOTE — Telephone Encounter (Signed)
I called pt to discuss. No answer, left a message asking her to call me back. 

## 2018-06-26 ENCOUNTER — Encounter: Payer: Self-pay | Admitting: Neurology

## 2018-06-26 ENCOUNTER — Ambulatory Visit: Payer: BC Managed Care – PPO | Admitting: Neurology

## 2018-06-26 VITALS — BP 142/83 | HR 72 | Ht 62.0 in | Wt 127.0 lb

## 2018-06-26 DIAGNOSIS — G20 Parkinson's disease: Secondary | ICD-10-CM | POA: Diagnosis not present

## 2018-06-26 NOTE — Patient Instructions (Addendum)
We will continue with the Mirapex at 0.25 mg 2 pills 3 times a day, we can change the Rx to 0.5 mg strength, call in the next 2-3 weeks for an update; we can even go up to 0.75 mg three times a day at that point. Taking at 8, 2 PM and 8 PM is good.  I am so glad you are feeling better, you look much better too!  Your weight is better too.  We will consider PT and OT at our next visit.

## 2018-06-26 NOTE — Progress Notes (Signed)
Subjective:    Patient ID: Paula Fuller is a 67 y.o. female.  HPI     Interim history:   Paula Fuller is a 67 year old right-handed woman with an underlying medical history of anxiety, depression, irritable bowel syndrome, recent loss of weight, prior history of hyperlipidemia and reflux disease, who presents for follow-up consultation of Paula Fuller parkinsonism. The patient is unaccompanied today. I last saw Paula Fuller on 03/27/2018, at which time Paula Fuller reported still struggling with Paula Fuller anxiety and depressive symptoms. We talked about Paula Fuller DaT scan results. Paula Fuller had tried multiple different antidepressants in the past, Paula Fuller was given most recently a sample for Trintellix by Paula Fuller PCP but had not started it. Paula Fuller was advised to start it.  Paula Fuller was also advised to start Mirapex with gradual increase. Paula Fuller called in the interim in November 2019 reporting that Mirapex was not making a big difference. Paula Fuller was advised that Paula Fuller was on a low-dose of advised to increase it. Paula Fuller had also called in September 2019 reporting that Paula Fuller was not able to tolerate the antidepressant that was given to Paula Fuller by Paula Fuller PCP. Paula Fuller was advised to talk to Paula Fuller family doctor about alternative treatment options for anxiety and depression. Paula Fuller had reduced Paula Fuller Xanax.  Today, 06/26/2018: Paula Fuller reports feeling better. Paula Fuller has been able to come off see next and has moved 0.25 mg strength available for as needed use but has not used it in the past 2 weeks or so. After Paula Fuller stopped the Trintellix in September 2019 Paula Fuller PCP put Paula Fuller on generic Celexa 10 mg strength. Paula Fuller has been on it for about 2 months and feels much better with regards to Paula Fuller personality, Paula Fuller anxiety level and Paula Fuller outlook on things. Paula Fuller has been able to tolerate the Mirapex and low dose and just now increased to 2 pills 3 times a day, generally takes it at 8 AM, 2 PM and 8 PM. Paula Fuller is able to deal with a tremor, Paula Fuller would like to see a reduction in Paula Fuller tremor but has been able to function fairly well. 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 Paula Fuller appetite has improved significantly and Paula Fuller has been able to gain weight, Paula Fuller gained about 10 pounds in the past 3 months. Paula Fuller is watchful of this, as Paula Fuller was overweight at one point in Paula Fuller life.   The patient's allergies, current medications, family history, past medical history, past social history, past surgical history and problem list were reviewed and updated as appropriate.    Previously:   I first met Paula Fuller on 12/13/2017 at the request of Paula Fuller primary care provider, at which time Paula Fuller reported a bilateral hand tremor of at least 2 years duration as well as fine motor dyscontrol. Right-sided symptoms were worse on the left. Paula Fuller examination was concerning for parkinsonism with right-sided lateralization. I suggested we proceed with a brain MRI and we subsequently also proceeded with a DaT scan.  Paula Fuller had a brain MRI with and without contrast on 12/27/2017 and I reviewed the results: IMPRESSION: This MRI of the brain with and without contrast shows the following: 1.    There are some scattered T2/FLAIR hyperintense foci consistent with mild age-related chronic microvascular ischemic changes 2.    There are no acute findings and there is a normal enhancement pattern.   We called Paula Fuller with Paula Fuller test  results.  Paula Fuller had a nuclear medicine DaT scan on 03/21/2018 and a very tight results: IMPRESSION: Near absent radiotracer activity within the bilateral posterior striata (putamen) in a pattern typical of a Parkinson's syndrome. pathology. There is mild decreased relative activity in the head of the LEFT caudate nucleus compared to the RIGHT.   We called Paula Fuller with Paula Fuller test results and scheduled Paula Fuller for a sooner than scheduled appointment.    12/13/2017: (Paula Fuller) reports a right more than left hand tremor for the past 2 years, worse in the past  few months. Paula Fuller has noticed other issues including fine motor dyscontrol, balance issues, mild short-term memory loss. 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 Paula Fuller has never considered seeing ENT for this. Paula Fuller has suboptimally controlled anxiety. Paula Fuller has been on Lexapro in the past which caused significant GI related side effects and for the past 2 months Paula Fuller has been on sertraline. Paula Fuller does not feel that the sertraline has been helpful. Paula Fuller has been on Xanax off and on since the late 90s when Paula Fuller had to take care of Paula Fuller father. Paula Fuller has been on Xanax consistently 3 times a day for the past couple of years, since 2016 or so. Paula Fuller is currently on sertraline 100 mg daily and alprazolam 0.5 mg 3 times a day. Paula Fuller has had significant weight loss and had multiple tests for this including endoscopy and CAT scan all per Paula Fuller report without significant abnormality is thankfully. Paula Fuller is using some protein milkshakes to supplement Paula Fuller diet. Paula Fuller tremor is at rest as well as with activity at times. It is more noticeable on the right, Paula Fuller has also noticed difficulty with fine motor tasks also more on the right. Paula Fuller fell recently once in the yard, as Paula Fuller was standing up from the squatting position and thankfully did not hurt herself.I reviewed your office note from 10/29/2017. Paula Fuller has no family history of tremors but reports a family history of Parkinson's disease, Paula Fuller father's first cousin apparently had Parkinson's disease, another first cousin of Paula Fuller father had some other neurological disease, maybe MS. Paula Fuller has worried about MS. Paula Fuller saw a neurologist in [place], [place] in August 2018 told Paula Fuller Paula Fuller does not have MS. Paula Fuller is a nonsmoker and does not drink alcohol and drinks caffeine one serving per day on average. Paula Fuller is married and lives with Paula Fuller husband, they have 1 daughter. 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  Paula Fuller Past Medical History Is  Significant For: Past Medical History:  Diagnosis Date  . Anxiety disorder   . GERD (gastroesophageal reflux disease)   . High cholesterol   . IBS (irritable bowel syndrome)   . Insomnia     Paula Fuller Past Surgical History Is Significant For:  Paula Fuller Family History Is Significant For: No family history on file.  Paula Fuller Social History Is Significant For: Social History   Socioeconomic History  . Marital status: Married    Spouse name: Not on file  . Number of children: Not on file  . Years of education: Not on file  . Highest education level: Not on file  Occupational History  . Not on file  Social Needs  . Financial resource strain: Not on file  . Food insecurity:    Worry: Not on file    Inability: Not on file  . Transportation needs:    Medical: Not on file  Non-medical: Not on file  Tobacco Use  . Smoking status: Never Smoker  . Smokeless tobacco: Never Used  Substance and Sexual Activity  . Alcohol use: Never    Frequency: Never  . Drug use: Never  . Sexual activity: Not on file  Lifestyle  . Physical activity:    Days per week: Not on file    Minutes per session: Not on file  . Stress: Not on file  Relationships  . Social connections:    Talks on phone: Not on file    Gets together: Not on file    Attends religious service: Not on file    Active member of club or organization: Not on file    Attends meetings of clubs or organizations: Not on file    Relationship status: Not on file  Other Topics Concern  . Not on file  Social History Narrative  . Not on file    Paula Fuller Allergies Are:  No Known Allergies:   Paula Fuller Current Medications Are:  Outpatient Encounter Medications as of 06/26/2018  Medication Sig  . ALPRAZolam (XANAX) 0.25 MG tablet Take 0.25 mg by mouth daily as needed for anxiety.  . citalopram (CELEXA) 10 MG tablet Take 10 mg by mouth daily.  . Coenzyme Q10 (COQ-10) 200 MG CAPS Take by mouth.  . eszopiclone (LUNESTA) 2 MG TABS tablet Take 2 mg by  mouth at bedtime as needed for sleep. Take immediately before bedtime  . Melatonin 5 MG TABS Take 5 mg by mouth at bedtime.  . pramipexole (MIRAPEX) 0.25 MG tablet Take 1 pill 3 times a day for 2 weeks then 2 pills 3 times a day thereafter. (Patient taking differently: Take 0.5 mg by mouth 3 (three) times daily. Take 1 pill 3 times a day for 2 weeks then 2 pills 3 times a day thereafter.)   No facility-administered encounter medications on file as of 06/26/2018.   :  Review of Systems:  Out of a complete 14 point review of systems, all are reviewed and negative with the exception of these symptoms as listed below: Review of Systems  Neurological:       Pt presents today to discuss Paula Fuller PD. Pt reports that Paula Fuller feels much better after reducing Paula Fuller xanax and starting pramipexole.    Objective:  Neurological Exam  Physical Exam Physical Examination:   Vitals:   06/26/18 0819  BP: (!) 142/83  Pulse: 72    General Examination: The patient is a very pleasant 67 y.o. female in no acute distress. Paula Fuller appears well-developed and well-nourished and well groomed. Good spirits, much less anxious than before.  HEENT:Normocephalic, atraumatic, pupils are equal, round and reactive to light and accommodation. Paula Fuller has corrective eyeglasses, extraocular tracking is fairly well-preserved , mild facial masking is noted. No significant lip or jaw tremor is noted today. Airway examination is stable. Paula Fuller has minimal hypophonia, no dysarthria, mild nuchal rigidity.  Chest:Clear to auscultation without wheezing, rhonchi or crackles noted.  Heart:S1+S2+0, regular and normal without murmurs, rubs or gallops noted.   Abdomen:Soft, non-tender and non-distended with normal bowel sounds appreciated on auscultation.  Extremities:There is no pitting edema in the distal lower extremities bilaterally.   Skin: Warm and dry without trophic changes noted.   Musculoskeletal: exam reveals no obvious joint  deformities, tenderness or joint swelling or erythema.   Neurologically:  Mental status: The patient is awake, alert and oriented in all 4 spheres. Paula Fuller immediate and remote memory, attention, language skills and fund of  knowledge are appropriate. There is no evidence of aphasia, agnosia, apraxia or anomia with some slowness in thinking at times, Speech is clear with normal prosody and enunciation. Thought process is linear. Mood is normal and affect is bluntedis as well.   Cranial nerves II - XII are as described above under HEENT exam. In addition: shoulder shrug is normal, left . Motor exam: Normal bulk, and strength are noted. Paula Fuller has mild increase in tone in the right upper extremity with slight cogwheeling noted, better today. There is a mild intermittent resting tremor in the right hand, altogether better.   (On 12/13/2017: On Archimedes spiral drawing Paula Fuller has slight insecurity with both hands, no obvious tremor though. Handwriting is legible, not particularly tremulous, but mildly micrographic.)   Paula Fuller has a slight postural tremor in both upper extremities, Paula Fuller has no significant action tremor, no intention tremor. On fine motor testing with finger taps Paula Fuller has mild difficulty on the right, slight difficulty on the left, mild fatiguing/decrease in amplitude noted on the right, similarly with foot taps Paula Fuller has mild difficulty on the right, slight difficulty in the left. Rapid alternating patting is fairly well-preserved bilaterally.  Romberg is negative. Reflexes are 2+ throughout.   Cerebellar testing: No dysmetria or intention tremor. There is no truncal or gait ataxia.  Sensory exam: intact to light touch.  Gait, station and balance: Paula Fuller stands without difficulty, posture is age-appropriate, left shoulder perhaps slightly higher than right. Paula Fuller walks with a normal stride length and fairly good pace, slight decrease in arm swing noted on the right. Paula Fuller turns well, balance is preserved.  Stable to slightly better today.   Assessment and Plan:   In summary, Paula Fuller is a very pleasant 67 year old female ith an underlying medical history of anxiety, depression, irritable bowel syndrome, weight loss, prior history of hyperlipidemia and reflux disease, who presents for FU neurologic consultation of Paula Fuller parkinsonism with symptoms dating back to about 2 years ago with right hand tremor as one of Paula Fuller symptoms. 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 Paula Fuller brain MRI showed age-appropriate findings in June 2019. Paula Fuller has established treatment with low-dose Mirapex. Paula Fuller is encouraged to continue with 0.25 mg strength 2 pills 3 times a day. Paula Fuller is advised to update [place] in the next 2-3 weeks as to Paula Fuller symptoms and we can consider changing the prescription to Mirapex 0.5 mg strength one pill 3 times a day or even increase it to 0.75 mg 3 times a day. Paula Fuller is doing much better with regards to Paula Fuller anxiety and depression symptoms with the help of Paula Fuller PCP, has established treatment with low-dose Celexa, could not tolerate Trintellix. Paula Fuller is also not on Xanax on a regular basis and Paula Fuller is advised to be cautious with the use of Xanax as needed.  I again stressed the importance of a healthy lifestyle in general. I encouraged the patient to eat healthy, exercise daily and keep well hydrated, to keep a scheduled bedtime and wake time routine, to not skip any meals and eat healthy snacks in between meals and to have protein with every meal. In particular, I stressed the importance of regular exercise, within of course the patient's own mobility limitations. Paula Fuller is advised to be proactive about constipation issues. Paula Fuller is advised that it is imperative that Paula Fuller exercise on a regular basis and keep socially and mentally very active. Paula Fuller has been walking about 2 miles per day  on average. Paula Fuller walks outside weather permitting or inside the  house. Paula Fuller is socially more active as well. I'm pleased to see how Paula Fuller is doing today. I suggested we continue with the current medication regimen and I see Paula Fuller back in about 4 months, sooner if needed, and Paula Fuller will give [place] an update by phone call in the next 3 weeks or so. I answered all Paula Fuller questions today and Paula Fuller was in agreement. I spent 25 minutes in total face-to-face time with the patient, more than 50% of which was spent in counseling and coordination of care, reviewing test results, reviewing medication and discussing or reviewing the diagnosis of PD, its prognosis and treatment options. Pertinent laboratory and imaging test results that were available during this visit with the patient were reviewed by me and considered in my medical decision making (see chart for details).

## 2018-09-16 ENCOUNTER — Encounter: Payer: Self-pay | Admitting: Neurology

## 2018-10-28 ENCOUNTER — Ambulatory Visit: Payer: Medicare Other | Admitting: Neurology

## 2018-11-14 ENCOUNTER — Other Ambulatory Visit: Payer: Self-pay | Admitting: Neurology

## 2018-11-18 NOTE — Telephone Encounter (Signed)
I called pt. She does not want a refill on her mirapex. It is causing problems with her sleep, she reports that she is talking and moving around a lot in her sleep. Pt is requesting a sooner appt to discuss. Pt is agreeable to a virtual visit.  Pt understands that although there may be some limitations with this type of visit, we will take all precautions to reduce any security or privacy concerns.  Pt understands that this will be treated like an in office visit and we will file with pt's insurance, and there may be a patient responsible charge related to this service.  Pt is agreeable to a [email or web address] visit. Emailed pt the link. Successful test run with [email or web address] was completed with pt.  Pt's meds, allergies, and PMH were updated.

## 2018-11-21 ENCOUNTER — Other Ambulatory Visit: Payer: Self-pay

## 2018-11-21 ENCOUNTER — Ambulatory Visit (INDEPENDENT_AMBULATORY_CARE_PROVIDER_SITE_OTHER): Payer: Medicare Other | Admitting: Neurology

## 2018-11-21 ENCOUNTER — Encounter: Payer: Self-pay | Admitting: Neurology

## 2018-11-21 DIAGNOSIS — G20 Parkinson's disease: Secondary | ICD-10-CM | POA: Diagnosis not present

## 2018-11-21 MED ORDER — ROTIGOTINE 2 MG/24HR TD PT24: 1.0000 | MEDICATED_PATCH | Freq: Every day | TRANSDERMAL | 3 refills | Status: DC

## 2018-11-21 NOTE — Progress Notes (Addendum)
Interim history:  Paula Fuller is a 68 year old right-handed woman with an underlying medical history of anxiety, depression, irritable bowel syndrome, recent loss of weight, prior history of hyperlipidemia and reflux disease, who presents for a virtual, video based visit via [email or web address] for follow-up consultation of her parkinsonism. The patient is unaccompanied today and joins via laptop from home. I am located in my office. I last saw her on 06/26/2018, At which time she reported feeling better, she reported that the Mirapex was helpful.  She had stopped a new antidepressant and started generic Celexa.  Her anxiety was better.  She was able to tolerate the Mirapex.  Today, 11/21/18: Please see below for virtual visit documentation.    The patient's allergies, current medications, family history, past medical history, past social history, past surgical history and problem list were reviewed and updated as appropriate.    Previously:   I saw her on 03/27/2018, at which time she reported still struggling with her anxiety and depressive symptoms. We talked about her DaT scan results. She had tried multiple different antidepressants in the past, she was given most recently a sample for Trintellix by her PCP but had not started it. She was advised to start it.   She was also advised to start Mirapex with gradual increase. She called in the interim in November 2019 reporting that Mirapex was not making a big difference. She was advised that she was on a low-dose of advised to increase it. She had also called in September 2019 reporting that she was not able to tolerate the antidepressant that was given to her by her PCP. She was advised to talk to her family doctor about alternative treatment options for anxiety and depression. She had reduced her Xanax.    I first met her on 12/13/2017 at the request of her primary care provider, at which time she reported a bilateral hand tremor of at least 2 years  duration as well as fine motor dyscontrol. Right-sided symptoms were worse on the left. Her examination was concerning for parkinsonism with right-sided lateralization. I suggested we proceed with a brain MRI and we subsequently also proceeded with a DaT scan.  She had a brain MRI with and without contrast on 12/27/2017 and I reviewed the results: IMPRESSION: This MRI of the brain with and without contrast shows the following: 1.    There are some scattered T2/FLAIR hyperintense foci consistent with mild age-related chronic microvascular ischemic changes 2.    There are no acute findings and there is a normal enhancement pattern.   We called her with her test results.  She had a nuclear medicine DaT scan on 03/21/2018 and a very tight results: IMPRESSION: Near absent radiotracer activity within the bilateral posterior striata (putamen) in a pattern typical of a Parkinson's syndrome. pathology. There is mild decreased relative activity in the head of the LEFT caudate nucleus compared to the RIGHT.   We called her with her test results and scheduled her for a sooner than scheduled appointment.   12/13/2017: (She) reports a right more than left hand tremor for the past 2 years, worse in the past few months. She has noticed other issues including fine motor dyscontrol, balance issues, mild short-term memory loss. 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 She has never considered seeing ENT for this. She has suboptimally controlled anxiety. She has been on Lexapro in the past which caused significant GI related side effects and for the  past 2 months she has been on sertraline. She does not feel that the sertraline has been helpful. She has been on Xanax off and on since the late 90s when she had to take care of her father. She has been on Xanax consistently 3 times a day for the past couple of years, since 2016 or so. She is currently on sertraline 100 mg daily and alprazolam 0.5 mg 3  times a day. She has had significant weight loss and had multiple tests for this including endoscopy and CAT scan all per her report without significant abnormality is thankfully. She is using some protein milkshakes to supplement her diet. Her tremor is at rest as well as with activity at times. It is more noticeable on the right, she has also noticed difficulty with fine motor tasks also more on the right. She fell recently once in the yard, as she was standing up from the squatting position and thankfully did not hurt herself.I reviewed your office note from 10/29/2017. She has no family history of tremors but reports a family history of Parkinsons disease, her fathers first cousin apparently had Parkinsons disease, another first cousin of her father had some other neurological disease, maybe MS. She has worried about MS. She saw a neurologist in [place], [place] in August 2018 told her she does not have MS. She is a nonsmoker and does not drink alcohol and drinks caffeine one serving per day on average. She is married and lives with her husband, they have 1 daughter. 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  Her Past Medical History Is Significant For: Past Medical History:  Diagnosis Date   Anxiety disorder    GERD (gastroesophageal reflux disease)    High cholesterol    IBS (irritable bowel syndrome)    Insomnia     Her Past Surgical History Is Significant For:    Her Family History Is Significant For: No family history on file.  Her Social History Is Significant For: Social History   Socioeconomic History   Marital status: Married    Spouse name: Not on file   Number of children: Not on file   Years of education: Not on file   Highest education level: Not on file  Occupational History   Not on file  Social Needs   Financial resource strain: Not on file   Food insecurity:    Worry: Not on file     Inability: Not on file   Transportation needs:    Medical: Not on file    Non-medical: Not on file  Tobacco Use   Smoking status: Never Smoker   Smokeless tobacco: Never Used  Substance and Sexual Activity   Alcohol use: Never    Frequency: Never   Drug use: Never   Sexual activity: Not on file  Lifestyle   Physical activity:    Days per week: Not on file    Minutes per session: Not on file   Stress: Not on file  Relationships   Social connections:    Talks on phone: Not on file    Gets together: Not on file    Attends religious service: Not on file    Active member of club or organization: Not on file    Attends meetings of clubs or organizations: Not on file    Relationship status: Not on file  Other Topics Concern   Not on file  Social History  Narrative   Not on file    Her Allergies Are:  No Known Allergies:   Her Current Medications Are:  Outpatient Encounter Medications as of 11/21/2018  Medication Sig   ALPRAZolam (XANAX) 0.25 MG tablet Take 0.25 mg by mouth daily as needed for anxiety.   citalopram (CELEXA) 10 MG tablet Take 10 mg by mouth daily.   Melatonin 5 MG TABS Take 5 mg by mouth at bedtime.   pramipexole (MIRAPEX) 0.25 MG tablet Take 1 pill 3 times a day for 2 weeks then 2 pills 3 times a day thereafter. (Patient taking differently: Take 0.5 mg by mouth 3 (three) times daily. Take 1 pill 3 times a day for 2 weeks then 2 pills 3 times a day thereafter.)   No facility-administered encounter medications on file as of 11/21/2018.   :  Review of Systems:  Out of a complete 14 point review of systems, all are reviewed and negative with the exception of these symptoms as listed below:  Virtual Visit via Video Note on @ TODAY@  I connected with@ on 11/21/18 at  9:00 AM EDT by a video enabled telemedicine application and verified that I am speaking with the correct person using two identifiers.   I discussed the limitations of evaluation and  management by telemedicine and the availability of in person appointments. The patient expressed understanding and agreed to proceed.  History of Present Illness: She reports not doing well on the Mirapex currently, when she increased it to 0.5 mg 3 times daily, she started noticing increase in weight, increase in eating and snacking, also not sleeping as well.  She does have a tendency to yell out in her sleep and mumbles unintelligibly per husband's report.  It does not help her tremor as much as it used to.  She has reduced it on her own to 2 tablets twice daily currently.  This is the 0.25 mg strength.  Thankfully, she is stable otherwise, she has not fallen, she drinks water well.  She is active, she likes gardening, but has noticed that when she stands up quickly she gets lightheaded or dizzy.    Observations/Objective: There are no recent vital signs available for my review in her chart, the most recent vital signs are from 06/2018. On examination, she is pleasant and conversant, in no acute distress.  She does appear to have gained weight.  Face is symmetric with mild facial masking noted, she wears corrective eyeglasses.  Hearing is grossly intact.  Comprehension and language skills are good, no obvious dysarthria or hypophonia on speech evaluation.  Shoulder height is fairly equal. On motor exam: mild slowness, normal appearing bulk, no significant postural tremor in the UEs, no visible resting tremor.  Finger taps mildly impaired b/l, R more than L. She stands without difficulty, posture fairly good, Romberg is neg. She walks barefoot, reduced arm swing b/l, no obvious shuffling noted. She turns slowly.  Assessment and Plan: In summary, Paula Fuller is a very pleasant 68 year old female ith an underlying medical history of anxiety, depression, irritable bowel syndrome,weight loss, prior history of hyperlipidemia and reflux disease, who presents fora virtual, video based FUconsultation of  her parkinsonism with symptoms dating back to about 2-3 years ago with right hand tremor as one of her symptoms. 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 Her brain MRI showed age-appropriate findings in June 2019. She started treatment with Mirapex low-dose and initially did quite  well, noticed an improvement in her tremor and her exam had improved.  However, she is having more trouble with taking 0.25 mg strength 2 pills 3 times daily.  She states that she read about Sinemet.  She was wondering if she should take it, her primary care physician had wondered why she is not on it.  I explained to her that Sinemet works very well in most patients and sometimes in younger Parkinson's patients we try a dopamine agonist first, sometimes we have very good results with monotherapy with a dopamine agonist.  Unfortunately, she has had some side effects including excessive eating and weight gain.  This can be seen in the context of taking a dopamine agonist and I explained dopamine dysregulation to her.  Nevertheless, perhaps we have a chance to have less side effects on a patch form, she is advised to start Neupro in patch form 2 mg strength once daily, she is strongly advised to adhere to schedule and dispose of the old patch safely.  If this does not work out in the first month we will certainly have a good chance to have great results with Sinemet and we will switch.  She is agreeable to this approach.  She is advised to be proactive about lightheadedness and good hydration, good nutrition, activity level.  She denies any significant memory issues or constipation issues thankfully.   I suggested we try Neupro 2 mg strength once daily for the next 3 or 4 weeks, she is advised to give [place] an update by the end of this month so we can decide to perhaps go to the next dose which would be 4 mg patch.  She is advised to first taper completely off of the  Mirapex by reducing it to 1 pill twice daily for the next week and then stop.  If needed, I will be happy to call in a prescription for Sinemet which is very likely to help, we talked about potential side effects of Sinemet as well in case we get started on it soon.  She is advised to follow-up in clinic in about 3 months, sooner if needed.  She is advised to keep in touch with [place] by phone or email.  I answered all her questions today and she was in agreement.  Follow Up Instructions: 1. Taper off mirapex. 2. Start neupro patch 2 mg/24 h. Watch for SEs, update by ph or email in 3-4 weeks, could increase to 4 mg at that time. Consider C/L next. 3. Follow-up in clinic for ace-to-face visit in 3 months. 4. Call or email through My Chart for any interim questions or concerns.    I discussed the assessment and treatment plan with the patient. The patient was provided an opportunity to ask questions and all were answered. The patient agreed with the plan and demonstrated an understanding of the instructions.   The patient was advised to call back or seek an in-person evaluation if the symptoms worsen or if the condition fails to improve as anticipated.  I provided 30 minutes of non-face-to-face time during this encounter.   Star Age, MD

## 2018-11-21 NOTE — Patient Instructions (Signed)
Given verbally, during today's virtual video-based encounter, with verbal feedback received.   

## 2018-11-28 ENCOUNTER — Ambulatory Visit: Payer: Medicare Other | Admitting: Neurology

## 2019-01-29 IMAGING — NM NM DATSCAN
4 series · 34 of 38 positions shown · non-contrast
Comparison: Brain MRI 12/27/2017

CLINICAL DATA: RIGHT-sided hand and leg tremors for 5 months

EXAM:
NUCLEAR MEDICINE BRAIN IMAGING WITH SPECT  (DaTscan )
TECHNIQUE: SPECT images of the brain were obtained after intravenous injection
of radiopharmaceutical. 4 hour post injection imaging. Appropriate
positioning. 0.8 ml lugols solution administered in a.m
RADIOPHARMACEUTICALS:  4.7 millicuries I 123 Ioflupane

[Series 1: spect - (id) _(id)_cor · 4.1mm · 4.14mm/px · 5 of 128 frames shown]
[frame 11/128]
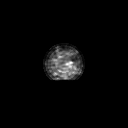
[frame 32/128]
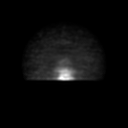
[frame 54/128]
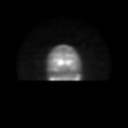
[frame 75/128]
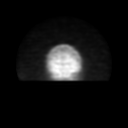
[frame 118/128]
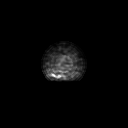

[Series 1: brain spect · 4.14mm/px · 6 of 120 frames shown]
[frame 11/120  full-range]
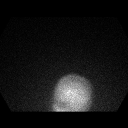
[frame 31/120  full-range]
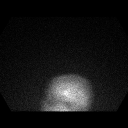
[frame 51/120  full-range]
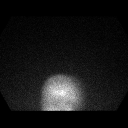
[frame 71/120  full-range]
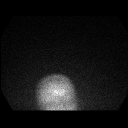
[frame 91/120  full-range]
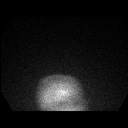
[frame 111/120  full-range]
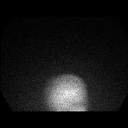

[Series 1: spect - (id) _(id)_tra · 4.1mm · 4.14mm/px · 5 of 128 frames shown]
[frame 11/128]
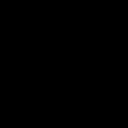
[frame 32/128]
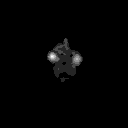
[frame 75/128]
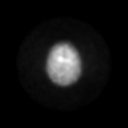
[frame 96/128]
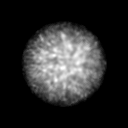
[frame 118/128]
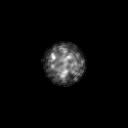

[Series 1016: mpr tra 4.1mm range · 0.79mm/px · 18 of 20 slices shown]
[im 1/20]
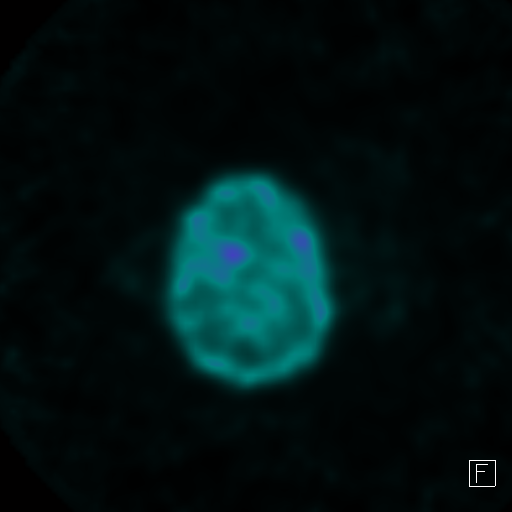
[im 2/20]
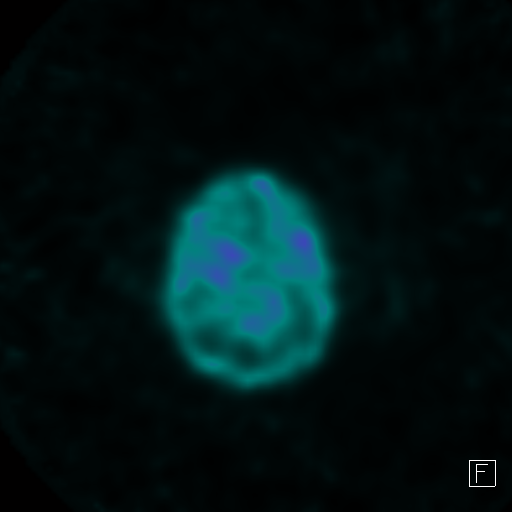
[im 3/20]
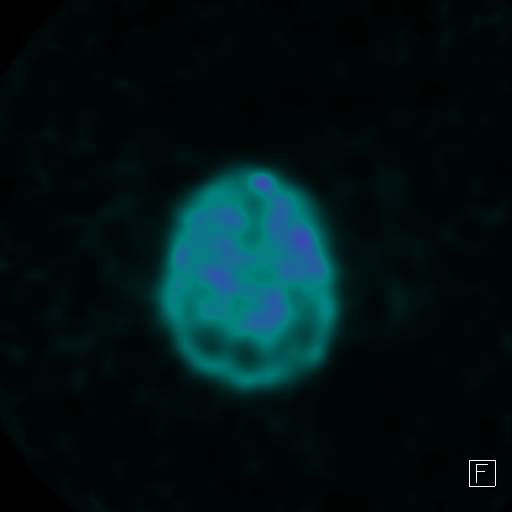
[im 4/20]
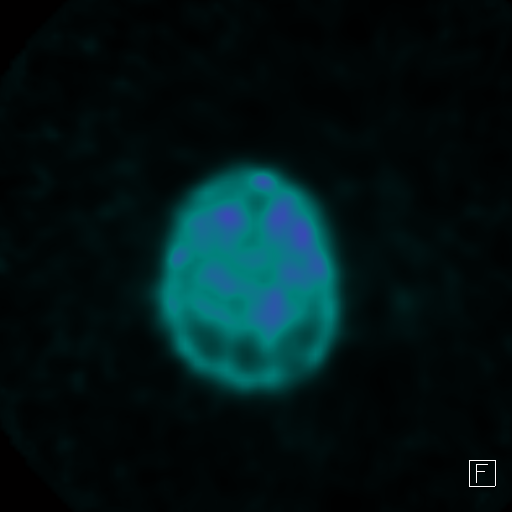
[im 5/20]
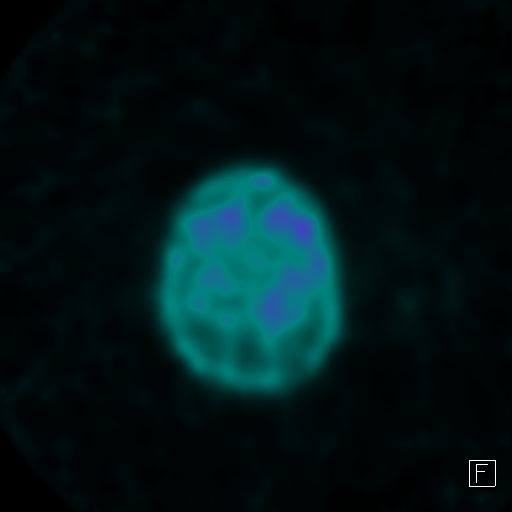
[im 7/20]
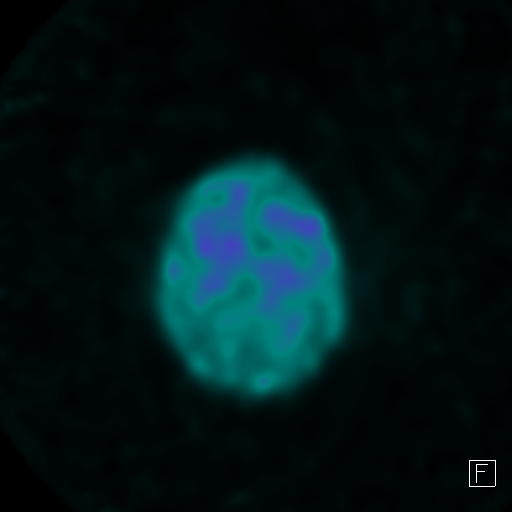
[im 8/20]
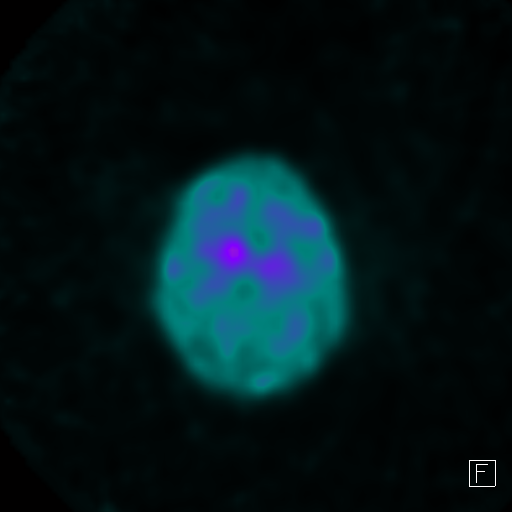
[im 9/20]
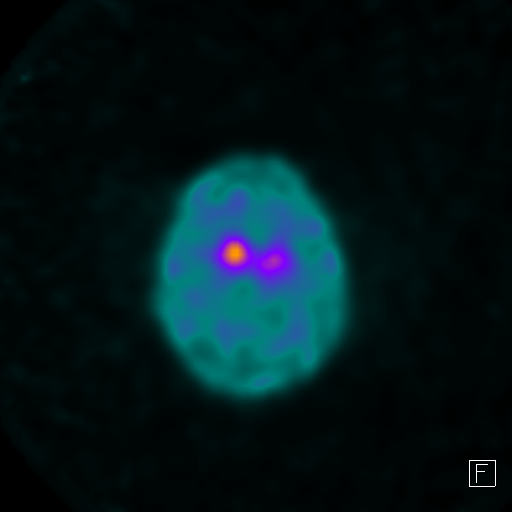
[im 10/20]
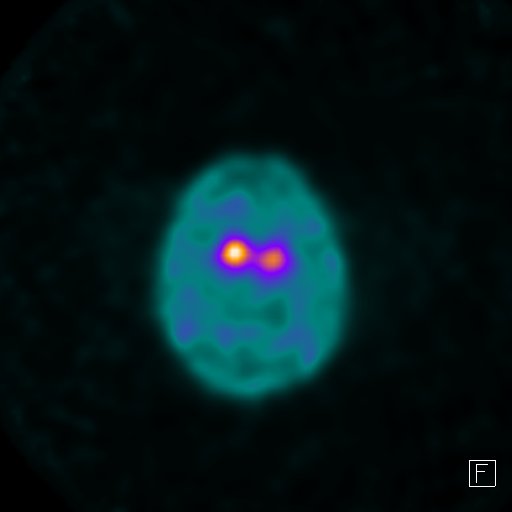
[im 11/20]
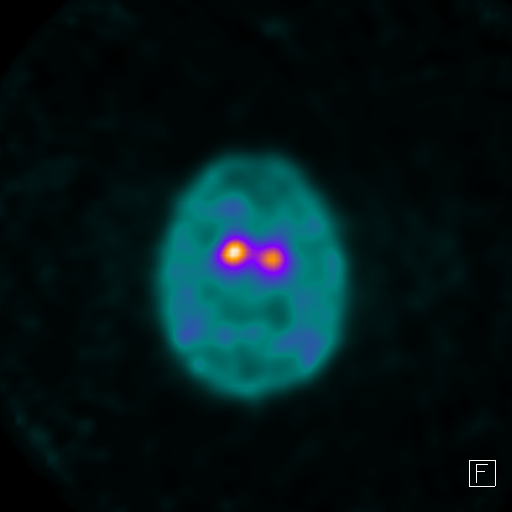
[im 12/20]
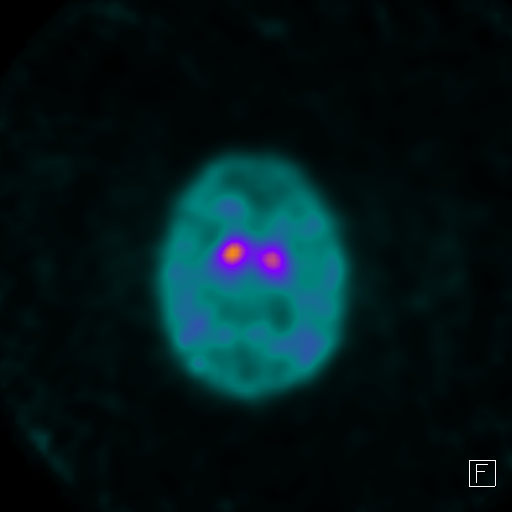
[im 13/20]
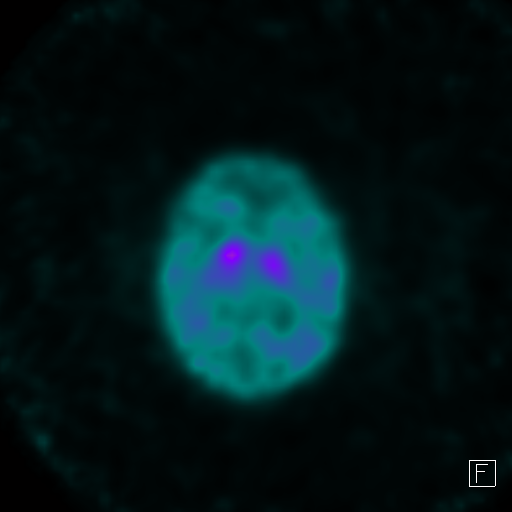
[im 14/20]
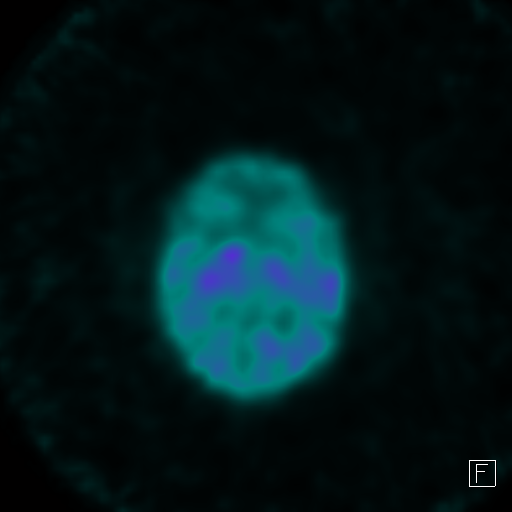
[im 15/20]
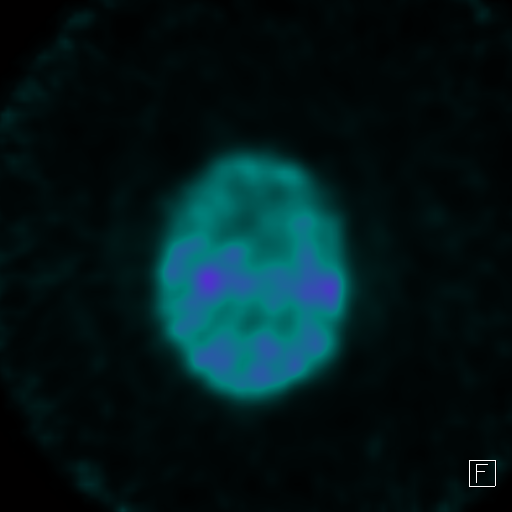
[im 17/20]
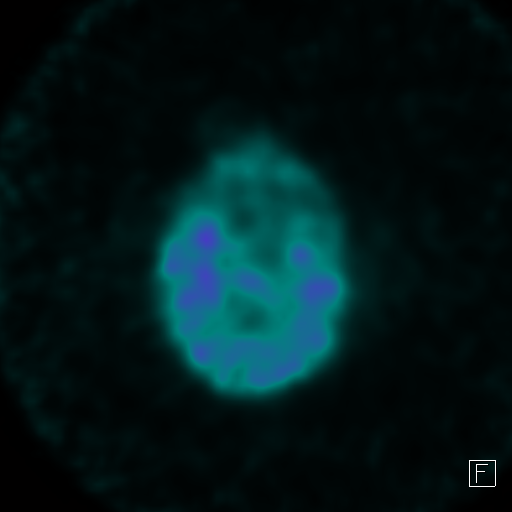
[im 18/20]
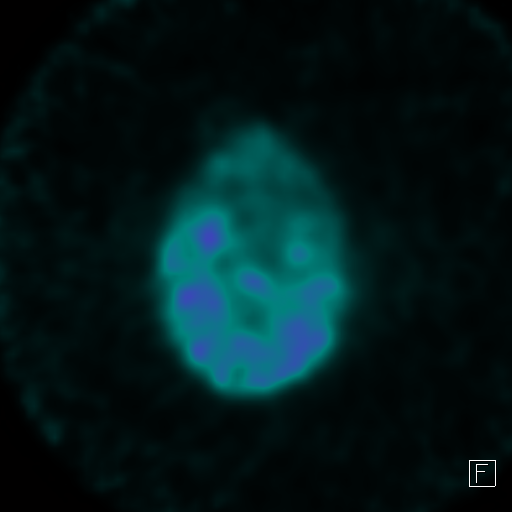
[im 19/20]
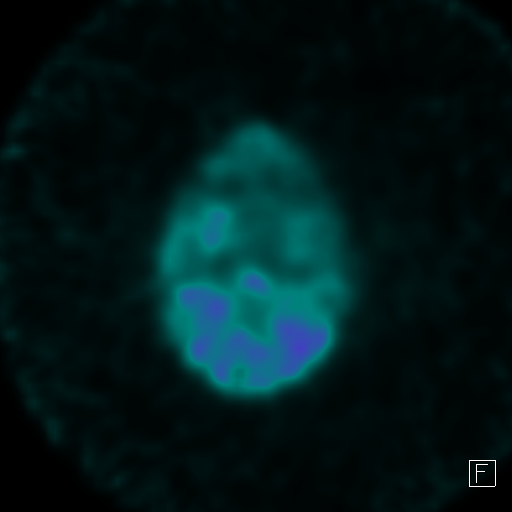
[im 20/20]
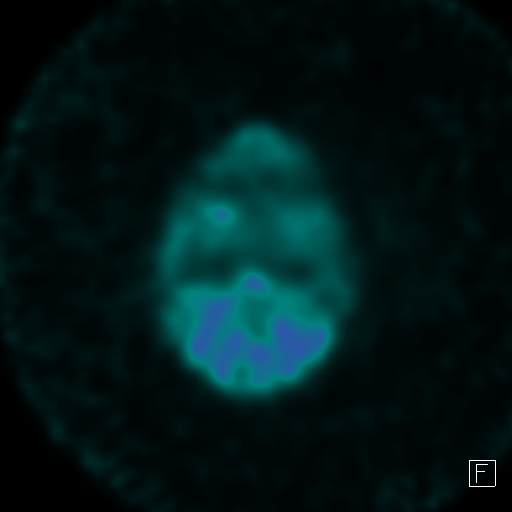

[34 of 38 positions shown; findings below may reference images not displayed]

FINDINGS: Decreased radiotracer accumulation within the posterior LEFT and
RIGHT striata (putamen). There is mild decreased relative activity
in the head of the LEFT caudate nucleus compared to the RIGHT.
Findings are consistent with loss of dopamine transporter
populations within the posterior striata in a pattern typical of
Parkinson's syndrome type pathology.
IMPRESSION: Near absent radiotracer activity within the bilateral posterior
striata (putamen) in a pattern typical of a Parkinson's syndrome
pathology.

## 2019-02-17 ENCOUNTER — Other Ambulatory Visit: Payer: Self-pay

## 2019-02-17 ENCOUNTER — Encounter: Payer: Self-pay | Admitting: Neurology

## 2019-02-17 ENCOUNTER — Ambulatory Visit (INDEPENDENT_AMBULATORY_CARE_PROVIDER_SITE_OTHER): Payer: Medicare Other | Admitting: Neurology

## 2019-02-17 VITALS — BP 120/82 | HR 76 | Ht 62.0 in | Wt 150.0 lb

## 2019-02-17 DIAGNOSIS — G479 Sleep disorder, unspecified: Secondary | ICD-10-CM

## 2019-02-17 DIAGNOSIS — G20 Parkinson's disease: Secondary | ICD-10-CM | POA: Diagnosis not present

## 2019-02-17 MED ORDER — CARBIDOPA-LEVODOPA 25-100 MG PO TABS: ORAL_TABLET | ORAL | 5 refills | Status: DC

## 2019-02-17 MED ORDER — CLONAZEPAM 0.25 MG PO TBDP: 0.2500 mg | ORAL_TABLET | Freq: Every day | ORAL | 5 refills | Status: DC

## 2019-02-17 NOTE — Progress Notes (Signed)
Subjective:    Patient ID: Paula Fuller is a 68 y.o. female.  HPI     Interim history:  Paula Fuller is a 68 year old right-handed woman with an underlying medical history of anxiety, depression, irritable bowel syndrome, recent loss of weight, prior history of hyperlipidemia and reflux disease, who presents for follow-up consultation of her parkinsonism. The patient is unaccompanied today. I last saw her on 11/21/2018 and virtual visit, at which time she reported side effects with Mirapex including weight gain and not sleeping well at night.  I suggested we switch her over to the Neupro patch, 2 mg strength. Today, 02/17/2019: She reports that the Neupro has not helped the tremor as much as she was hoping.  In the first couple of weeks she noticed no difference, after a month she started noticing side effects including headaches, ongoing issues with sleep difficulty at night.  She also has dream enactment behavior including yelling out or mumbling and also flailing her arms and legs.  She has not fallen out of bed but has knocked over her clock on the nightstand and her lamp.  Her husband reports that these typically happen in the beginning of the night but she has no recollection of any dreams or of the activity.  Mood wise she is fairly stable with some flareup of anxiety from time to time, she no longer takes any Xanax during the day but has been taking 0.25 mg at night.  She has not been taking it every night.  She had tried clonazepam in the past.  She would be willing to try clonazepam again and stay off the Xanax.  She only has a few pills left of the Xanax and has an appointment with her primary care physician in 2 weeks.  She has continued have some weight gain and also feels swollen at times in her hands and feet by the end of the day.  Her tremor has been a little bit worse she feels.  She has no significant constipation, tries to exercise in the form of walking and tries to hydrate well with  water.   The patient's allergies, current medications, family history, past medical history, past social history, past surgical history and problem list were reviewed and updated as appropriate.    Previously:   I saw her on 06/26/2018, At which time she reported feeling better, she reported that the Mirapex was helpful.  She had stopped a new antidepressant and started generic Celexa.  Her anxiety was better.  She was able to tolerate the Mirapex.       I saw her on 03/27/2018, at which time she reported still struggling with her anxiety and depressive symptoms. We talked about her DaT scan results. She had tried multiple different antidepressants in the past, she was given most recently a sample for Trintellix by her PCP but had not started it. She was advised to start it.   She was also advised to start Mirapex with gradual increase. She called in the interim in November 2019 reporting that Mirapex was not making a big difference. She was advised that she was on a low-dose of advised to increase it. She had also called in September 2019 reporting that she was not able to tolerate the antidepressant that was given to her by her PCP. She was advised to talk to her family doctor about alternative treatment options for anxiety and depression. She had reduced her Xanax.     I first met her on 12/13/2017  at the request of her primary care provider, at which time she reported a bilateral hand tremor of at least 2 years duration as well as fine motor dyscontrol. Right-sided symptoms were worse on the left. Her examination was concerning for parkinsonism with right-sided lateralization. I suggested we proceed with a brain MRI and we subsequently also proceeded with a DaT scan.  She had a brain MRI with and without contrast on 12/27/2017 and I reviewed the results: IMPRESSION: This MRI of the brain with and without contrast shows the following: 1.    There are some scattered T2/FLAIR hyperintense foci  consistent with mild age-related chronic microvascular ischemic changes 2.    There are no acute findings and there is a normal enhancement pattern.   We called her with her test results.  She had a nuclear medicine DaT scan on 03/21/2018 and a very tight results: IMPRESSION: Near absent radiotracer activity within the bilateral posterior striata (putamen) in a pattern typical of a Parkinson's syndrome. pathology. There is mild decreased relative activity in the head of the LEFT caudate nucleus compared to the RIGHT.   We called her with her test results and scheduled her for a sooner than scheduled appointment.   12/13/2017: (She) reports a right more than left hand tremor for the past 2 years, worse in the past few months. She has noticed other issues including fine motor dyscontrol, balance issues, mild short-term memory loss. 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 She has never considered seeing ENT for this. She has suboptimally controlled anxiety. She has been on Lexapro in the past which caused significant GI related side effects and for the past 2 months she has been on sertraline. She does not feel that the sertraline has been helpful. She has been on Xanax off and on since the late 90s when she had to take care of her father. She has been on Xanax consistently 3 times a day for the past couple of years, since 2016 or so. She is currently on sertraline 100 mg daily and alprazolam 0.5 mg 3 times a day. She has had significant weight loss and had multiple tests for this including endoscopy and CAT scan all per her report without significant abnormality is thankfully. She is using some protein milkshakes to supplement her diet. Her tremor is at rest as well as with activity at times. It is more noticeable on the right, she has also noticed difficulty with fine motor tasks also more on the right. She fell recently once in the yard, as she was standing up from the squatting  position and thankfully did not hurt herself.I reviewed your office note from 10/29/2017. She has no family history of tremors but reports a family history of Parkinson's disease, her father's first cousin apparently had Parkinson's disease, another first cousin of her father had some other neurological disease, maybe MS. She has worried about MS. She saw a neurologist in [place], [place] in August 2018 told her she does not have MS. She is a nonsmoker and does not drink alcohol and drinks caffeine one serving per day on average. She is married and lives with her husband, they have 1 daughter. 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  Her Past Medical History Is Significant For: Past Medical History:  Diagnosis Date  . Anxiety disorder   . GERD (gastroesophageal reflux disease)   . High cholesterol   .   IBS (irritable bowel syndrome)   . Insomnia     Her Past Surgical History Is Significant For: Past Surgical History:  Procedure Laterality Date  . CHOLECYSTECTOMY      Her Family History Is Significant For: No family history on file.  Her Social History Is Significant For: Social History   Socioeconomic History  . Marital status: Married    Spouse name: Not on file  . Number of children: Not on file  . Years of education: Not on file  . Highest education level: Not on file  Occupational History  . Not on file  Social Needs  . Financial resource strain: Not on file  . Food insecurity    Worry: Not on file    Inability: Not on file  . Transportation needs    Medical: Not on file    Non-medical: Not on file  Tobacco Use  . Smoking status: Never Smoker  . Smokeless tobacco: Never Used  Substance and Sexual Activity  . Alcohol use: Never    Frequency: Never  . Drug use: Never  . Sexual activity: Not on file  Lifestyle  . Physical activity    Days per week: Not on file    Minutes per session: Not on file  .  Stress: Not on file  Relationships  . Social Herbalist on phone: Not on file    Gets together: Not on file    Attends religious service: Not on file    Active member of club or organization: Not on file    Attends meetings of clubs or organizations: Not on file    Relationship status: Not on file  Other Topics Concern  . Not on file  Social History Narrative  . Not on file    Her Allergies Are:  No Known Allergies:   Her Current Medications Are:  Outpatient Encounter Medications as of 02/17/2019  Medication Sig  . rotigotine (NEUPRO) 2 MG/24HR Place 1 patch onto the skin daily.  . [DISCONTINUED] ALPRAZolam (XANAX) 0.25 MG tablet Take 0.25 mg by mouth daily as needed for anxiety.  . [DISCONTINUED] citalopram (CELEXA) 10 MG tablet Take 10 mg by mouth daily.  . [DISCONTINUED] Melatonin 5 MG TABS Take 5 mg by mouth at bedtime.  . [DISCONTINUED] pramipexole (MIRAPEX) 0.25 MG tablet Take 1 pill 3 times a day for 2 weeks then 2 pills 3 times a day thereafter. (Patient taking differently: Take 0.5 mg by mouth 3 (three) times daily. Take 1 pill 3 times a day for 2 weeks then 2 pills 3 times a day thereafter.)   No facility-administered encounter medications on file as of 02/17/2019.   :  Review of Systems:  Out of a complete 14 point review of systems, all are reviewed and negative with the exception of these symptoms as listed below: Review of Systems  Neurological:       Pt presents today to discuss her PD. Pt is having lots of side effects to the neupro patch and wants to switch to an oral medication.    Objective:  Neurological Exam  Physical Exam Physical Examination:   Vitals:   02/17/19 0824  BP: 120/82  Pulse: 76    General Examination: The patient is a very pleasant 68 y.o. female in no acute distress. She appears well-developed and well-nourished and well groomed.   HEENT:Normocephalic, atraumatic, pupils are equal, round and reactive to light and  accommodation. She has corrective eyeglasses, extraocular tracking is fairly well-preserved,  mild facial masking is noted. No significant lip or jaw tremor is noted today. Airway examination is stable. She has minimal hypophonia, no dysarthria, mild nuchal rigidity.  Chest:Clear to auscultation without wheezing, rhonchi or crackles noted.  Heart:S1+S2+0, regular and normal without murmurs, rubs or gallops noted.   Abdomen:Soft, non-tender and non-distended with normal bowel sounds appreciated on auscultation.  Extremities:There is no pitting edema in the distal lower extremities bilaterally.   Skin: Warm and dry without trophic changes noted.   Musculoskeletal: exam reveals no obvious joint deformities, tenderness or joint swelling or erythema.   Neurologically:  Mental status: The patient is awake, alert and oriented in all 4 spheres. Her immediate and remote memory, attention, language skills and fund of knowledge are appropriate. There is no evidence of aphasia, agnosia, apraxia or anomiawith some slowness in thinking at times,Speech is clear with normal prosody and enunciation. Thought process is linear. Mood is normal and affect is good.    Cranial nerves II - XII are as described above under HEENT exam. In addition: shoulder shrug is normal, left . Motor exam: Normal bulk, and strength are noted. She has mild increase in tone in the right upper extremity with slight cogwheeling noted, better today. There is a resting tremor in the right hand.   (On 12/13/2017: On Archimedes spiral drawing she has slight insecurity with both hands, no obvious tremor though. Handwriting is legible, not particularly tremulous, but mildly micrographic.)  She has a slight postural tremor in both upper extremities, she has no significant action tremor, no intention tremor. On fine motor testing with finger taps she has mild difficulty on the right, slight difficulty on the left, mild  fatiguing/decrease in amplitude noted on the right, similarly with foot taps she has mild difficulty on the right, slight difficulty in the left. Rapid alternating patting is fairly well-preserved bilaterally.  Romberg is negative. Reflexes are 2+ throughout.   Cerebellar testing: No dysmetria or intention tremor.There is no truncal or gait ataxia.  Sensory exam: intact to light touch.  Gait, station and balance: She stands without difficulty, posture is age-appropriate, left shoulder perhaps slightly higher than right. She walks with a normal stride length and fairly good pace, decrease in arm swing noted on the right more than L. She turns well, balance is preserved.  Assessment and Plan:  In summary, Paula Fuller is a very pleasant 67-year old female ith an underlying medical history of anxiety, depression, irritable bowel syndrome,weight loss, prior history of hyperlipidemia and reflux disease, who presents forFUconsultation of herparkinsonism with symptoms dating back to about 2-3 years ago with right hand tremor as one of her symptoms. Her history, examination and DaT scan in 09/19 support the diagnosis of right-sided predominantParkinson's disease. Her brain MRI showed age-appropriate findings in June 2019.She started treatment with Mirapex low-dose and initially did quite well, noticed an improvement in her tremor and her exam had improved.  However, she was having more trouble with taking 0.25 mg strength 2 pills 3 times daily. She had also gained weight.  She was advised to discontinue Mirapex and start Neupro patch in May 2020.  She has not had any telltale results from the Neupro, has had ongoing issues with swelling and weight gain, has also noticed some headaches after starting the Neupro.  We mutually agreed to stop the Neupro patch.  She is advised to stop from this morning's dose.  She is advised to start Sinemet half a pill twice daily for the next 3 days   and then half a pill 3  times a day for the next 3 days after and then 1 pill 3 times daily after that.  I suggested she take the Sinemet before her mealtimes.  She can adjust the timing accordingly.  We talked about expectations, potential side effects and limitations of the medication. She has had trouble at night with her sleep, she has had sleep talking and dream enactment type behaviors.  She has taken Xanax at night low dose.  She is advised to start clonazepam in lieu of Xanax.  She is advised to follow-up with her primary care physician as scheduled and I prescribed clonazepam low-dose 0.25 mg strength at bedtime.  She is agreeable.  I suggested a follow-up routinely in the office in about 3 months, sooner if needed.  She is advised to keep in touch with [place] by phone or email.  I answered all her questions today and she was in agreement. I spent 25 minutes in total face-to-face time with the patient, more than 50% of which was spent in counseling and coordination of care, reviewing test results, reviewing medication and discussing or reviewing the diagnosis of PD, its prognosis and treatment options. Pertinent laboratory and imaging test results that were available during this visit with the patient were reviewed by me and considered in my medical decision making (see chart for details).

## 2019-02-17 NOTE — Patient Instructions (Addendum)
As discussed, we will stop the Neupro patch, you can stop as of today.    We will start you on Sinemet (generic name: carbidopa-levodopa) 25/100 mg: Take half a pill twice daily (8 AM and noon) for 3 days, then half a pill 3 times a day (8 AM, noon, and 4 PM) for 3 days, then one pill 3 times a day thereafter. Please try to take the medication away from you mealtimes, that is, ideally either one hour before or 2 hours after your meal to ensure optimal absorption. The medication can interfere with the protein content of your meal and trying to the protein in your food and therefore not get fully absorbed.  Common side effects reported are: Nausea, vomiting, sedation, confusion, lightheadedness. Rare side effects include hallucinations, severe nausea or vomiting, diarrhea and significant drop in blood pressure especially when going from lying to standing or from sitting to standing.   For your sleep difficulty and dream enactment behavior at night, we will switch you over to clonazepam 0.25 mg each bedtime.  Please talk to your primary care physician about staying off of the Xanax if possible.I would not recommend combining Xanax and clonazepam at night.

## 2019-03-18 ENCOUNTER — Telehealth: Payer: Self-pay | Admitting: Neurology

## 2019-03-18 NOTE — Telephone Encounter (Signed)
Pt is asking for a call to discuss questions and concerns she has about the clonazePAM (KLONOPIN) 0.25 MG disintegrating tablet

## 2019-03-18 NOTE — Telephone Encounter (Signed)
I called pt. She reports that her PCP called in xanax and hydralazine and but since then [facility] can't give her the klonopin, saying it was discontinued. Our notes do not reflect that we discontinued the klonopin. Pt says that klonopin is the only thing that helps her sleep and she does not want to stop it.  I called [facility], spoke with Edison Nasuti. His notes say that pt was stopping clonazepam per the PCP office. Dr. Mancel Parsons office called and asked [facility] to d/c clonazepam.

## 2019-03-18 NOTE — Telephone Encounter (Signed)
Please asked patient to get in touch with her primary care physician regarding this.  If she is able to take the clonazepam at night and PCP is okay with it, I can restart it.  If PCP wants her to take no Xanax during the day because of the clonazepam, she will have to decide if she can go without the Xanax altogether.  I would favor that she confirms this with her PCP first.

## 2019-03-18 NOTE — Telephone Encounter (Signed)
I called pt and explained this to her. She will contact her PCP to discuss. She would gladly go without the xanax if she could have the klonopin QHS. She will let us know when she has discussed this further with her PCP.

## 2019-05-21 ENCOUNTER — Telehealth: Payer: Self-pay | Admitting: Neurology

## 2019-05-21 ENCOUNTER — Encounter: Payer: Self-pay | Admitting: Neurology

## 2019-05-21 ENCOUNTER — Other Ambulatory Visit: Payer: Self-pay

## 2019-05-21 ENCOUNTER — Ambulatory Visit (INDEPENDENT_AMBULATORY_CARE_PROVIDER_SITE_OTHER): Payer: Medicare Other | Admitting: Neurology

## 2019-05-21 VITALS — BP 135/88 | HR 74 | Temp 97.5°F | Ht 62.0 in | Wt 144.0 lb

## 2019-05-21 DIAGNOSIS — F419 Anxiety disorder, unspecified: Secondary | ICD-10-CM

## 2019-05-21 DIAGNOSIS — F329 Major depressive disorder, single episode, unspecified: Secondary | ICD-10-CM

## 2019-05-21 DIAGNOSIS — G479 Sleep disorder, unspecified: Secondary | ICD-10-CM | POA: Diagnosis not present

## 2019-05-21 DIAGNOSIS — G20 Parkinson's disease: Secondary | ICD-10-CM

## 2019-05-21 MED ORDER — CITALOPRAM HYDROBROMIDE 20 MG PO TABS: ORAL_TABLET | ORAL | 3 refills | Status: DC

## 2019-05-21 MED ORDER — CLONAZEPAM 0.5 MG PO TBDP: 0.5000 mg | ORAL_TABLET | Freq: Every day | ORAL | 1 refills | Status: DC

## 2019-05-21 MED ORDER — CARBIDOPA-LEVODOPA 25-100 MG PO TABS: 1.0000 | ORAL_TABLET | Freq: Four times a day (QID) | ORAL | 3 refills | Status: DC

## 2019-05-21 NOTE — Telephone Encounter (Signed)
I would recommend that she continue to fill the clonazepam prescription 0.5 mg each night as she is not actually taking the Xanax, has only taken half a pill once since she last filled it, it was given to her for a temporary flareup in her anxiety at the time of her PCP appointment.  Please call pharmacy back to override their hold on clonazepam prescription.

## 2019-05-21 NOTE — Patient Instructions (Signed)
As discussed, we will increase your clonazepam to 0.5 mg each bedtime.    For anxiety and depression, please start citalopram 20 mg strength half a pill daily, at bedtime or in the evening.  You can increase it to 1 full pill daily after about a month if tolerated. As discussed we will increase your Sinemet to 1 pill 4 times a day, take it at 7, 11, 3 PM and 7 PM daily. I am glad to hear you can tolerate this medication and that you have not noticed any adverse effects.

## 2019-05-21 NOTE — Telephone Encounter (Signed)
I called pt. I advised her that I already let the pharmacy know that they may dispense the clonazepam. Pt verbalized understanding.

## 2019-05-21 NOTE — Telephone Encounter (Signed)
I called [facility], advised them to fill the clonazepam. They wanted to know what to do with the xanax RX; I recommended that they get in touch with the prescribing office regarding the xanax. [facility] will get the clonazepam ready for pick up.

## 2019-05-21 NOTE — Telephone Encounter (Signed)
Pt called stating that the pharmacy is waiting for a call from the provider so that she can get her clonazePAM (KLONOPIN) 0.5 MG disintegrating tablet Please advise.

## 2019-05-21 NOTE — Telephone Encounter (Signed)
I called [facility]. Pt picked up a RX of xanax #60 on 05/13/19. They need permission from Dr. Rexene Alberts to fill the clonazepam because of this.  I called pt. She reports that she did pick up xanax #60 but only took 1/2 tablet and knows that she cannot take both xanax and clonazepam.

## 2019-05-21 NOTE — Progress Notes (Signed)
Subjective:    Patient ID: Paula Fuller is a 68 y.o. female.  HPI     Interim history:   Paula Fuller is a 68 year old right-handed woman with an underlying medical history of anxiety, depression, irritable bowel syndrome, recent loss of weight, prior history of hyperlipidemia and reflux disease, who presents for follow-up consultation of her parkinsonism. The patient is unaccompanied today. I last saw her on 02/17/2019, at which time she reported no significant results from the Neupro, had ongoing issues with weight gain and swelling and also felt that she had developed some headaches after starting the Neupro patch.  She reported dream enactment behaviors.  I suggested she stop the Neupro and start a trial of Sinemet.  In addition, she was advised to start clonazepam at night.  She called in the interim reporting that her PCP did not want her on both Xanax and clonazepam and we have discussed this as well.  She was advised to discuss this matter with her primary care physician and see if she could go without the Xanax during the day and continue with the clonazepam at night.  Today, 05/21/2019: She reports That she has noticed improvement with the Sinemet, she has not noticed any side effects thankfully.  Her headaches improved after she stopped the Neupro patch and also her spending habits improved, she was ordering things online without realizing she was overspending.  She has been able to get back on track with her weight.  She still has significant anxiety and depression.  She was given a short course of Xanax by her PCP but only took it 1 time.  In the past, she has tried multiple antidepressants.  She had GI related side effects given her irritable bowel syndrome.  She was able to tolerate sertraline in the past but had some weight gain and does not wish to have any further struggled with her weight gain.  She would be amenable to trying an antidepressant again.  She does not sleep very well,  clonazepam has helped but only keeps her asleep for 2 hours at a time.  She is wondering if we can increase it.  The patient's allergies, current medications, family history, past medical history, past social history, past surgical history and problem list were reviewed and updated as appropriate.    Previously:     I saw her on 11/21/2018 in a virtual visit, at which time she reported side effects with Mirapex including weight gain and not sleeping well at night.  I suggested we switch her over to the Neupro patch, 2 mg strength.   I saw her on 06/26/2018, At which time she reported feeling better, she reported that the Mirapex was helpful.  She had stopped a new antidepressant and started generic Celexa.  Her anxiety was better.  She was able to tolerate the Mirapex.   I saw her on 03/27/2018, at which time she reported still struggling with her anxiety and depressive symptoms. We talked about her DaT scan results. She had tried multiple different antidepressants in the past, she was given most recently a sample for Trintellix by her PCP but had not started it. She was advised to start it.   She was also advised to start Mirapex with gradual increase. She called in the interim in November 2019 reporting that Mirapex was not making a big difference. She was advised that she was on a low-dose of advised to increase it. She had also called in September 2019 reporting that she  was not able to tolerate the antidepressant that was given to her by her PCP. She was advised to talk to her family doctor about alternative treatment options for anxiety and depression. She had reduced her Xanax.     I first met her on 12/13/2017 at the request of her primary care provider, at which time she reported a bilateral hand tremor of at least 2 years duration as well as fine motor dyscontrol. Right-sided symptoms were worse on the left. Her examination was concerning for parkinsonism with right-sided lateralization. I  suggested we proceed with a brain MRI and we subsequently also proceeded with a DaT scan.  She had a brain MRI with and without contrast on 12/27/2017 and I reviewed the results: IMPRESSION: This MRI of the brain with and without contrast shows the following: 1.    There are some scattered T2/FLAIR hyperintense foci consistent with mild age-related chronic microvascular ischemic changes 2.    There are no acute findings and there is a normal enhancement pattern.   We called her with her test results.  She had a nuclear medicine DaT scan on 03/21/2018 and a very tight results: IMPRESSION: Near absent radiotracer activity within the bilateral posterior striata (putamen) in a pattern typical of a Parkinson's syndrome. pathology. There is mild decreased relative activity in the head of the LEFT caudate nucleus compared to the RIGHT.   We called her with her test results and scheduled her for a sooner than scheduled appointment.   12/13/2017: (She) reports a right more than left hand tremor for the past 2 years, worse in the past few months. She has noticed other issues including fine motor dyscontrol, balance issues, mild short-term memory loss. 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 She has never considered seeing ENT for this. She has suboptimally controlled anxiety. She has been on Lexapro in the past which caused significant GI related side effects and for the past 2 months she has been on sertraline. She does not feel that the sertraline has been helpful. She has been on Xanax off and on since the late 90s when she had to take care of her father. She has been on Xanax consistently 3 times a day for the past couple of years, since 2016 or so. She is currently on sertraline 100 mg daily and alprazolam 0.5 mg 3 times a day. She has had significant weight loss and had multiple tests for this including endoscopy and CAT scan all per her report without significant abnormality is  thankfully. She is using some protein milkshakes to supplement her diet. Her tremor is at rest as well as with activity at times. It is more noticeable on the right, she has also noticed difficulty with fine motor tasks also more on the right. She fell recently once in the yard, as she was standing up from the squatting position and thankfully did not hurt herself.I reviewed your office note from 10/29/2017. She has no family history of tremors but reports a family history of Parkinson's disease, her father's first cousin apparently had Parkinson's disease, another first cousin of her father had some other neurological disease, maybe MS. She has worried about MS. She saw a neurologist in [place], [place] in August 2018 told her she does not have MS. She is a nonsmoker and does not drink alcohol and drinks caffeine one serving per day on average. She is married and lives with her husband, they have 1 daughter. 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  Her Past Medical History Is Significant For: Past Medical History:  Diagnosis Date  . Anxiety disorder   . GERD (gastroesophageal reflux disease)   . High cholesterol   . IBS (irritable bowel syndrome)   . Insomnia     Her Past Surgical History Is Significant For: Past Surgical History:  Procedure Laterality Date  . CHOLECYSTECTOMY      Her Family History Is Significant For: No family history on file.  Her Social History Is Significant For: Social History   Socioeconomic History  . Marital status: Married    Spouse name: Not on file  . Number of children: Not on file  . Years of education: Not on file  . Highest education level: Not on file  Occupational History  . Not on file  Social Needs  . Financial resource strain: Not on file  . Food insecurity    Worry: Not on file    Inability: Not on file  . Transportation needs    Medical: Not on file    Non-medical: Not on  file  Tobacco Use  . Smoking status: Never Smoker  . Smokeless tobacco: Never Used  Substance and Sexual Activity  . Alcohol use: Never    Frequency: Never  . Drug use: Never  . Sexual activity: Not on file  Lifestyle  . Physical activity    Days per week: Not on file    Minutes per session: Not on file  . Stress: Not on file  Relationships  . Social Herbalist on phone: Not on file    Gets together: Not on file    Attends religious service: Not on file    Active member of club or organization: Not on file    Attends meetings of clubs or organizations: Not on file    Relationship status: Not on file  Other Topics Concern  . Not on file  Social History Narrative  . Not on file    Her Allergies Are:  No Known Allergies:   Her Current Medications Are:  Outpatient Encounter Medications as of 05/21/2019  Medication Sig  . carbidopa-levodopa (SINEMET IR) 25-100 MG tablet Take 1/2 pill twice daily x 3 days, then 1/2 pill 3 times a day x 3 days, then 1 pill 3 times a day thereafter. (Patient taking differently: Take 1 tablet by mouth 3 (three) times daily. Take 1/2 pill twice daily x 3 days, then 1/2 pill 3 times a day x 3 days, then 1 pill 3 times a day thereafter.)  . clonazePAM (KLONOPIN) 0.25 MG disintegrating tablet Take 1 tablet (0.25 mg total) by mouth at bedtime.  . [DISCONTINUED] rotigotine (NEUPRO) 2 MG/24HR Place 1 patch onto the skin daily.   No facility-administered encounter medications on file as of 05/21/2019.   :  Review of Systems:  Out of a complete 14 point review of systems, all are reviewed and negative with the exception of these symptoms as listed below: Review of Systems  Neurological:       Pt presents today to discuss her PD. Pt has stopped xanax and neupro. Pt is wondering if the sinemet should be increased.    Objective:  Neurological Exam  Physical Exam Physical Examination:   Vitals:   05/21/19 0815  BP: 135/88  Pulse: 74  Temp:  (!) 97.5 F (36.4 C)    General Examination: The patient  is a very pleasant 68 y.o. female in no acute distress. She appears well-developed and well-nourished and well groomed. Mildly anxious, near tears at one point.  HEENT:Normocephalic, atraumatic, pupils are equal, round and reactive to light and accommodation. She has corrective eyeglasses, extraocular tracking is fairly well-preserved, mild facial masking is noted. No significant lip or jaw tremor is noted today. Airway examination is stable. She has minimal hypophonia, no dysarthria, mild nuchal rigidity.  Chest:Clear to auscultation without wheezing, rhonchi or crackles noted.  Heart:S1+S2+0, regular and normal without murmurs, rubs or gallops noted.   Abdomen:Soft, non-tender and non-distended with normal bowel sounds appreciated on auscultation.  Extremities:There is no pitting edema in the distal lower extremities bilaterally.   Skin: Warm and dry without trophic changes noted.   Musculoskeletal: exam reveals no obvious joint deformities, tenderness or joint swelling or erythema.   Neurologically:  Mental status: The patient is awake, alert and oriented in all 4 spheres. Her immediate and remote memory, attention, language skills and fund of knowledge are appropriate. There is no evidence of aphasia, agnosia, apraxia or anomiawith some slowness in thinking at times,Speech is clear with normal prosody and enunciation. Thought process is linear. Mood is normal and affect is good.    Cranial nerves II - XII are as described above under HEENT exam. In addition: shoulder shrug is normal, left. Motor exam: Normal bulk, and strength are noted. She has mild increase in tone in the right upper extremity with slight cogwheeling noted, better today. There is a Fairly consistent resting tremor in the right hand. Overall mild bradykinesia.  (On 12/13/2017: On Archimedes spiral drawing she has slight insecurity with both  hands, no obvious tremor though. Handwriting is legible, not particularly tremulous, but mildly micrographic.)  She has a slight postural tremor in both upper extremities, she has no significant action tremor, no intention tremor. On fine motor testing with finger taps she has mild to mod. difficulty on the right, mild on the left, mild fatiguing/decrease in amplitude noted on the right, similarly with foot taps she has mild difficulty on the right, slight difficulty in the left. Rapid alternating patting is fairly well-preserved bilaterally.  Romberg is negative. Reflexes are2+ throughout.   Cerebellar testing: No dysmetria or intention tremor.There is no truncal or gait ataxia. Heel-to-shin good bilaterally. Sensory exam: intact to light touch.  Gait, station and balance: She stands without difficulty, posture is age-appropriate, left shoulder perhaps slightly higher than right. She walks with a normal stride length and fairly good pace, decrease in arm swing noted on the right more than L. She turns well, balance is preserved.  Assessment and Plan:  In summary, Shatiqua Heroux is a very pleasant 68 year old female ith an underlying medical history of anxiety, depression, irritable bowel syndrome,weight loss, prior history of hyperlipidemia and reflux disease, who presents forFUconsultation of herparkinsonism with symptoms dating back to about2-3years ago with right hand tremor as one of her symptoms. Her history, examination and DaT scan in 09/19 support the diagnosis of right-sided predominantParkinson's disease. Her brain MRI showed age-appropriate findings in June 2019.Shestarted treatment with Mirapex low-dose and initially did quite well, noticed an improvement in her tremor and her exam had improved. However, she was having more trouble with taking 0.25 mg strength 2 pills 3 times daily. She had also gained weight.  She was advised to discontinue Mirapex and start Neupro patch in  May 2020.  She has not had any telltale results from the Neupro, has had ongoing issues  with swelling and weight gain, has also noticed some headaches after starting the Neupro.  We mutually agreed to stop the Neupro patch.  She Has been on Sinemet since August 2020.  She feels it has been helpful, does not last as long.  Of note, she takes it at 7, 1 or 2 PM, and around 630 or 7 PM.  She is advised to increase it and bring the doses closer together and take 1 pill 4 times a day, at 7, 11, 3 PM and 7 PM.  In addition, I would like to increase the clonazepam to 0.5 mg each night.  We will also start for anxiety and depression, Celexa generic, 20 mg strength half a pill daily.  She can increase this after a month to 1 pill daily.  She is advised to call us with any interim questions or concerns. I suggested a follow-up routinely in the office in about 3 months, sooner if needed. She is advised to keep in touch with [place] by phone or email. I answered all her questions today and she was in agreement. I spent 30 minutes in total face-to-face time with the patient, more than 50% of which was spent in counseling and coordination of care, reviewing test results, reviewing medication and discussing or reviewing the diagnosis of PD, its prognosis and treatment options. Pertinent laboratory and imaging test results that were available during this visit with the patient were reviewed by me and considered in my medical decision making (see chart for details).

## 2019-05-21 NOTE — Telephone Encounter (Signed)
Pharmacy called in and stated pt just pick up a 30day supply of Xanax on 10/26 and dropped off the script of clonazePAM (KLONOPIN) 0.5 MG disintegrating tablet to be filled today, they would like a call back to discuss

## 2019-06-26 ENCOUNTER — Encounter: Payer: Self-pay | Admitting: Neurology

## 2019-06-26 ENCOUNTER — Telehealth: Payer: Self-pay | Admitting: Neurology

## 2019-06-26 NOTE — Telephone Encounter (Signed)
We can consider another antidepressant medication once she is stable and has no side effects from the Celexa in her system.  Please advise her to continue to stay off of the Celexa and update [place] in the next week or 2.  We can consider an alternative at the time.

## 2019-06-26 NOTE — Telephone Encounter (Signed)
I called pt and explained this to her. She is agreeable to the plan regarding celexa.  Pt reports that the sinemet QID is too much. She says her tremors are worse and she noticed that she is dizzy and confused. She is wondering if she can back the sinemet back down to TID.

## 2019-06-26 NOTE — Telephone Encounter (Signed)
Ok to reduce the C/L to tid.

## 2019-06-26 NOTE — Telephone Encounter (Signed)
I called pt. She reports that she took 1/2 tablet of celexa the whole month of November. She did not notice an improvement in her mood and she feel nauseous the whole month. She increased the celexa to 1 tablet in December and since then has been vomiting and barely been able to eat and has noticed no improvement in her mood. She is wondering what to do now because she is miserable on the celexa.

## 2019-06-26 NOTE — Telephone Encounter (Signed)
I called pt and advised her of this. Pt verbalized understanding.  

## 2019-06-26 NOTE — Telephone Encounter (Signed)
Pt called in and stated that she stopped taking celexa on Monday due to upset stomach and nausea

## 2019-08-21 ENCOUNTER — Encounter: Payer: Self-pay | Admitting: Neurology

## 2019-08-21 ENCOUNTER — Other Ambulatory Visit: Payer: Self-pay

## 2019-08-21 ENCOUNTER — Ambulatory Visit: Payer: Medicare PPO | Admitting: Neurology

## 2019-08-21 VITALS — BP 118/86 | HR 79 | Temp 97.3°F | Ht 62.0 in | Wt 139.3 lb

## 2019-08-21 DIAGNOSIS — G479 Sleep disorder, unspecified: Secondary | ICD-10-CM

## 2019-08-21 DIAGNOSIS — F32A Depression, unspecified: Secondary | ICD-10-CM

## 2019-08-21 DIAGNOSIS — F419 Anxiety disorder, unspecified: Secondary | ICD-10-CM

## 2019-08-21 DIAGNOSIS — G20A1 Parkinson's disease without dyskinesia, without mention of fluctuations: Secondary | ICD-10-CM

## 2019-08-21 DIAGNOSIS — G20 Parkinson's disease: Secondary | ICD-10-CM | POA: Diagnosis not present

## 2019-08-21 DIAGNOSIS — F329 Major depressive disorder, single episode, unspecified: Secondary | ICD-10-CM

## 2019-08-21 MED ORDER — CLONAZEPAM 0.5 MG PO TBDP: 0.5000 mg | ORAL_TABLET | Freq: Every day | ORAL | 1 refills | Status: DC

## 2019-08-21 MED ORDER — FLUOXETINE HCL 10 MG PO CAPS: 10.0000 mg | ORAL_CAPSULE | Freq: Every day | ORAL | 5 refills | Status: DC

## 2019-08-21 MED ORDER — BUSPIRONE HCL 5 MG PO TABS: 5.0000 mg | ORAL_TABLET | Freq: Three times a day (TID) | ORAL | 5 refills | Status: DC | PRN

## 2019-08-21 NOTE — Progress Notes (Signed)
Subjective:    Patient ID: Paula Fuller is a 69 y.o. female.  HPI     Interim history:   Paula Fuller is a 69 year old right-handed woman with an underlying medical history of anxiety, depression, irritable bowel syndrome, recent loss of weight, prior history of hyperlipidemia and reflux disease, who presents for follow-up consultation of her parkinsonism. The patient is unaccompanied today. I last saw her on 05/21/2019, at which time she felt improvement with the Sinemet.  She was tolerating it okay.  She had improvement in her headaches after she stopped the Neupro patch.  She had significant issues with anxiety and also depression.  She had weight gain in the past with sertraline.  I suggested a trial of Celexa.  I also suggested she increase the Sinemet to 4 times a day.    She emailed in the interim in early December 2020 indicating that the Sinemet 4 times a day felt too much, she felt dizzy.  She was advised to reduce it back to 3 times daily.  She also reported side effects from the Celexa including nausea and vomiting and appetite loss and was therefore advised to stop it.    Today, 08/21/2019: She reports struggling with her anxiety and depression.  She has had a lot of stress at home.  She cannot distance herself from the stress, she could not tolerate the Celexa.  In the past, she had tried Lexapro and also sertraline.  She did try Prozac also in the past and did okay with it.  She would be willing to retry that.  She would rather not see a psychiatrist.  Sometimes she wonders about her diagnosis, she declines a referral for second opinion.  She declines a referral for a surgical evaluation for DBS. She has constipation from time to time, has Colace on hand, if needed also MiraLAX.   She feels she has too much going on currently.  She would not harm herself, she does have some suicidal thoughts come to mind at night at times but would never harm herself she indicates.  She is not sleeping very  well, she gets about 4 hours of sleep when she takes clonazepam 0.5 mg each night.  She has been tempted to take a second pill but has not done so.  She did have anxiety relief with Xanax but understands that she cannot be on 2 different benzodiazepine medications.  She has never been on BuSpar.  She has not fallen, balance is okay, Sinemet works reasonably well.  She does not always feel that it lasts long enough.  She does take it 4 times a day currently.  She takes it 4 hourly, starting at 7 AM   The patient's allergies, current medications, family history, past medical history, past social history, past surgical history and problem list were reviewed and updated as appropriate.    Previously:   I saw her on 02/17/2019, at which time she reported no significant results from the Neupro, had ongoing issues with weight gain and swelling and also felt that she had developed some headaches after starting the Neupro patch.  She reported dream enactment behaviors.  I suggested she stop the Neupro and start a trial of Sinemet.  In addition, she was advised to start clonazepam at night.  She called in the interim reporting that her PCP did not want her on both Xanax and clonazepam and we have discussed this as well.  She was advised to discuss this matter with her primary  care physician and see if she could go without the Xanax during the day and continue with the clonazepam at night.    I saw her on 11/21/2018 in a virtual visit, at which time she reported side effects with Mirapex including weight gain and not sleeping well at night.  I suggested we switch her over to the Neupro patch, 2 mg strength.   I saw her on 06/26/2018, At which time she reported feeling better, she reported that the Mirapex was helpful.  She had stopped a new antidepressant and started generic Celexa.  Her anxiety was better.  She was able to tolerate the Mirapex.   I saw her on 03/27/2018, at which time she reported still struggling  with her anxiety and depressive symptoms. We talked about her DaT scan results. She had tried multiple different antidepressants in the past, she was given most recently a sample for Trintellix by her PCP but had not started it. She was advised to start it.   She was also advised to start Mirapex with gradual increase. She called in the interim in November 2019 reporting that Mirapex was not making a big difference. She was advised that she was on a low-dose of advised to increase it. She had also called in September 2019 reporting that she was not able to tolerate the antidepressant that was given to her by her PCP. She was advised to talk to her family doctor about alternative treatment options for anxiety and depression. She had reduced her Xanax.     I first met her on 12/13/2017 at the request of her primary care provider, at which time she reported a bilateral hand tremor of at least 2 years duration as well as fine motor dyscontrol. Right-sided symptoms were worse on the left. Her examination was concerning for parkinsonism with right-sided lateralization. I suggested we proceed with a brain MRI and we subsequently also proceeded with a DaT scan.  She had a brain MRI with and without contrast on 12/27/2017 and I reviewed the results: IMPRESSION: This MRI of the brain with and without contrast shows the following: 1.    There are some scattered T2/FLAIR hyperintense foci consistent with mild age-related chronic microvascular ischemic changes 2.    There are no acute findings and there is a normal enhancement pattern.   We called her with her test results.  She had a nuclear medicine DaT scan on 03/21/2018 and a very tight results: IMPRESSION: Near absent radiotracer activity within the bilateral posterior striata (putamen) in a pattern typical of a Parkinson's syndrome. pathology. There is mild decreased relative activity in the head of the LEFT caudate nucleus compared to the RIGHT.   We  called her with her test results and scheduled her for a sooner than scheduled appointment.   12/13/2017: (She) reports a right more than left hand tremor for the past 2 years, worse in the past few months. She has noticed other issues including fine motor dyscontrol, balance issues, mild short-term memory loss. 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 She has never considered seeing ENT for this. She has suboptimally controlled anxiety. She has been on Lexapro in the past which caused significant GI related side effects and for the past 2 months she has been on sertraline. She does not feel that the sertraline has been helpful. She has been on Xanax off and on since the late 90s when she had to take care of her father. She  has been on Xanax consistently 3 times a day for the past couple of years, since 2016 or so. She is currently on sertraline 100 mg daily and alprazolam 0.5 mg 3 times a day. She has had significant weight loss and had multiple tests for this including endoscopy and CAT scan all per her report without significant abnormality is thankfully. She is using some protein milkshakes to supplement her diet. Her tremor is at rest as well as with activity at times. It is more noticeable on the right, she has also noticed difficulty with fine motor tasks also more on the right. She fell recently once in the yard, as she was standing up from the squatting position and thankfully did not hurt herself.I reviewed your office note from 10/29/2017. She has no family history of tremors but reports a family history of Parkinson's disease, her father's first cousin apparently had Parkinson's disease, another first cousin of her father had some other neurological disease, maybe MS. She has worried about MS. She saw a neurologist in [place], [place] in August 2018 told her she does not have MS. She is a nonsmoker and does not drink alcohol and drinks caffeine one serving per day on average.  She is married and lives with her husband, they have 1 daughter. 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  Her Past Medical History Is Significant For: Past Medical History:  Diagnosis Date  . Anxiety disorder   . GERD (gastroesophageal reflux disease)   . High cholesterol   . IBS (irritable bowel syndrome)   . Insomnia     Her Past Surgical History Is Significant For: Past Surgical History:  Procedure Laterality Date  . CHOLECYSTECTOMY      Her Family History Is Significant For: No family history on file.  Her Social History Is Significant For: Social History   Socioeconomic History  . Marital status: Married    Spouse name: Not on file  . Number of children: Not on file  . Years of education: Not on file  . Highest education level: Not on file  Occupational History  . Not on file  Tobacco Use  . Smoking status: Never Smoker  . Smokeless tobacco: Never Used  Substance and Sexual Activity  . Alcohol use: Never  . Drug use: Never  . Sexual activity: Not on file  Other Topics Concern  . Not on file  Social History Narrative  . Not on file   Social Determinants of Health   Financial Resource Strain:   . Difficulty of Paying Living Expenses: Not on file  Food Insecurity:   . Worried About Charity fundraiser in the Last Year: Not on file  . Ran Out of Food in the Last Year: Not on file  Transportation Needs:   . Lack of Transportation (Medical): Not on file  . Lack of Transportation (Non-Medical): Not on file  Physical Activity:   . Days of Exercise per Week: Not on file  . Minutes of Exercise per Session: Not on file  Stress:   . Feeling of Stress : Not on file  Social Connections:   . Frequency of Communication with Friends and Family: Not on file  . Frequency of Social Gatherings with Friends and Family: Not on file  . Attends Religious Services: Not on file  . Active Member of Clubs or  Organizations: Not on file  . Attends Archivist Meetings: Not  on file  . Marital Status: Not on file    Her Allergies Are:  No Known Allergies:   Her Current Medications Are:  Outpatient Encounter Medications as of 08/21/2019  Medication Sig  . carbidopa-levodopa (SINEMET IR) 25-100 MG tablet Take 1 tablet by mouth 4 (four) times daily. Take at 7, 11, 3 PM and 7 PM.  . clonazePAM (KLONOPIN) 0.5 MG disintegrating tablet Take 1 tablet (0.5 mg total) by mouth at bedtime.  . [DISCONTINUED] clonazePAM (KLONOPIN) 0.5 MG disintegrating tablet Take 1 tablet (0.5 mg total) by mouth at bedtime.  . [DISCONTINUED] clonazePAM (KLONOPIN) 0.5 MG disintegrating tablet Take 1 tablet (0.5 mg total) by mouth at bedtime.  . busPIRone (BUSPAR) 5 MG tablet Take 1 tablet (5 mg total) by mouth 3 (three) times daily as needed.  Marland Kitchen FLUoxetine (PROZAC) 10 MG capsule Take 1 capsule (10 mg total) by mouth daily.  . [DISCONTINUED] citalopram (CELEXA) 20 MG tablet 1/2 pill daily at bedtime for 1 month, then 1 pill daily thereafter. (Patient not taking: Reported on 08/21/2019)   No facility-administered encounter medications on file as of 08/21/2019.  :  Review of Systems:  Out of a complete 14 point review of systems, all are reviewed and negative with the exception of these symptoms as listed below: Review of Systems  Neurological:       Here for 3 month f/u on parkinson's. Pt reports increase in depression and anxiety. She reports lack of energy and motivation throughout the day. She has also noticed an increase in dizziness, headaches and blurred vision since last visit.     Objective:  Neurological Exam  Physical Exam Physical Examination:   Vitals:   08/21/19 0920  BP: 118/86  Pulse: 79  Temp: (!) 97.3 F (36.3 C)    General Examination: The patient is a very pleasant 69 y.o. female in no acute distress. She appears well-developed and well-nourished and well groomed.   HEENT:Normocephalic,  atraumatic, pupils are equal, round and reactive to light and accommodation. She has corrective eyeglasses, extraocular tracking is fairly well-preserved, moderate facial masking is noted. No significant lip or jaw tremor is noted today. Airway examination is stable. She has minimal hypophonia, no dysarthria, mild nuchal rigidity.  Chest:Clear to auscultation without wheezing, rhonchi or crackles noted.  Heart:S1+S2+0, regular and normal without murmurs, rubs or gallops noted.   Abdomen:Soft, non-tender and non-distended with normal bowel sounds appreciated on auscultation.  Extremities:There is no pitting edema in the distal lower extremities bilaterally.   Skin: Warm and dry without trophic changes noted.   Musculoskeletal: exam reveals no obvious joint deformities, tenderness or joint swelling or erythema.   Neurologically:  Mental status: The patient is awake, alert and oriented in all 4 spheres. Her immediate and remote memory, attention, language skills and fund of knowledge are appropriate. There is no evidence of aphasia, agnosia, apraxia or anomiawith some slowness in thinking at times,Speech is clear with normal prosody and enunciation. Thought process is linear. Mood is normal and affect isgood.  Cranial nerves II - XII are as described above under HEENT exam. In addition: shoulder shrug is normal, left. Motor exam: Normal bulk, and strength are noted. She has mild increase in tone in the right upper extremity with slight cogwheeling noted, better today. There is a consistent resting tremor in the right hand. Overall mild bradykinesia.  (On 12/13/2017: On Archimedes spiral drawing she has slight insecurity with both hands, no obvious tremor though. Handwriting is legible, not particularly tremulous, but  mildly micrographic.)  She has a slight postural tremor in both upper extremities, she has no significant action tremor, no intention tremor. On fine motor testing  She has mild to moderate difficulty on the right and better on the left.  Cerebellar testing: No dysmetria or intention tremor.There is no truncal or gait ataxia. Heel-to-shin good bilaterally. Sensory exam: intact to light touch.  Gait, station and balance: She stands without difficulty, posture is age-appropriate, left shoulder perhaps slightly higher than right. She walks with a normal stride length and fairly good pace, decrease in arm swing noted on the rightmore than L. She turns well, balance is preserved.  Assessmentand Plan:  In summary, Paula Fuller is a very pleasant 69 year old female ith an underlying medical history of anxiety, depression, irritable bowel syndrome,weight loss, prior history of hyperlipidemia and reflux disease, who presents forFUconsultation of herparkinsonism with symptoms dating back to about2-3years ago with right hand tremor as one of her symptoms. Her history, examination and DaT scan in 09/19 support the diagnosis of right-sided predominantParkinson's disease. Her brain MRI showed age-appropriate findings in June 2019.Shestarted treatment with Mirapex low-dose and initially did quite well, noticed an improvement in her tremor and her exam had improved. However, shewashaving more trouble with taking 0.25 mg strength 2 pills 3 times daily. She had also gained weight. She was advised to discontinue Mirapex and start Neupro patch in May 2020. She did not have telltale results from the Neupro, had ongoing issues with swelling and weight gain, has also noticed some headaches after starting the Neupro. We stopped theNeupro patch and she started Sinemet in August 2020.  She has been taking 1 pill 4 times a day.  She had some dizziness from it.  She has been on clonazepam 0.5 mg each night since October.  She had been on Xanax before but no longer takes the Xanax.  She has struggled with anxiety and depression for years.  She has been on several different  medications, could not tolerate Celexa which we started last time.I suggested she start Prozac low dose and for anxiety I suggested BuSpar 5 mg up to 3 times daily as needed.  We talked about expectations, side effects and limitations of these medications.  She is wondering if she could increase the clonazepam.  I would favor we stay the same with all other medications for now and consider increasing the clonazepam next time.The Sinemet does not always last long enough she feels.  We can consider adding Comtan next time either for 2 out of the 4 doses or all 4 doses.We can also consider switching her to Sinemet CR but adding Comtan would be probably my favored step next time.  She declined a referral to psychiatry and a referral for a second opinion to neurology also an opinion for DBS surgery.  We can certainly pick up our discussions next time.  She is reminded to stay well-hydrated, well rested, keep [place] posted with a phone update or email update in 2 to 3 weeks and we can consider increasing the clonazepam at the time.  She is advised to call 911 should she have any sinister thoughts of suicide.  She promises that she would never do anything to harm herself.  This was an extended visit of over 40 minutes.  I answered all her questions today and she was in agreement.

## 2019-08-21 NOTE — Patient Instructions (Addendum)
Your exam is fairly stable thankfully.  Let us continue with your Sinemet 1 pill 4 times a day, starting at 7, 4 hourly.  In the near future, we can consider adding a new medication to the Sinemet called: Comtan (generic name is Entacapone) 200 mg Strength as an add-on to your Sinemet doses, either with 2 of the 4 doses are all 4 doses eventually.  This is an enzyme inhibitor, which Inhibits the mechanism of a enzyme called COMT, and as such alters the plasma pharmacokinetics of levodopa. In essence, the result is that you may have more levodopa in your system which then gets transformed to dopamine. We will pick up our discussion next time. For your depression and anxiety I suggest we start you on low-dose Prozac 10 mg strength daily.  Take in the morning. For Anxiety, we will start BuSpar generic, 5 mg strength, take 1 pill up to 3 times a day as needed, you can start with 1 pill once daily as needed and take it up to 3 times a day, you can also take it daily. Side effects may include and may not be limited to: dizziness, drowsiness, headache, nausea, nervousness, lightheadedness, restlessness, blurred vision, tiredness, and trouble sleeping may occur. For your sleep, we can Consider increasing the clonazepam to 1 mg each night, let us talk about it next time

## 2019-11-19 ENCOUNTER — Other Ambulatory Visit: Payer: Self-pay

## 2019-11-19 ENCOUNTER — Ambulatory Visit: Payer: Medicare PPO | Admitting: Neurology

## 2019-11-19 ENCOUNTER — Encounter: Payer: Self-pay | Admitting: Neurology

## 2019-11-19 VITALS — BP 120/82 | HR 80 | Temp 97.2°F | Ht 62.0 in | Wt 133.3 lb

## 2019-11-19 DIAGNOSIS — G20 Parkinson's disease: Secondary | ICD-10-CM

## 2019-11-19 DIAGNOSIS — F329 Major depressive disorder, single episode, unspecified: Secondary | ICD-10-CM | POA: Diagnosis not present

## 2019-11-19 DIAGNOSIS — G479 Sleep disorder, unspecified: Secondary | ICD-10-CM

## 2019-11-19 DIAGNOSIS — F419 Anxiety disorder, unspecified: Secondary | ICD-10-CM

## 2019-11-19 DIAGNOSIS — F32A Depression, unspecified: Secondary | ICD-10-CM

## 2019-11-19 MED ORDER — FLUOXETINE HCL 10 MG PO CAPS: 10.0000 mg | ORAL_CAPSULE | Freq: Every day | ORAL | 3 refills | Status: DC

## 2019-11-19 MED ORDER — CARBIDOPA-LEVODOPA ER 50-200 MG PO TBCR: 1.0000 | EXTENDED_RELEASE_TABLET | Freq: Every day | ORAL | 3 refills | Status: DC

## 2019-11-19 NOTE — Progress Notes (Signed)
Subjective:    Fuller ID: Paula Fuller is a 69 y.o. female.  HPI     Interim history:   Paula Fuller is a 69 year old right-handed woman with an underlying medical history of anxiety, depression, irritable bowel syndrome, recent loss of weight, prior history of hyperlipidemia and reflux disease, who presents for follow-up consultation of Paula Fuller parkinsonism. Paula Fuller is unaccompanied today. Paula last saw Paula Fuller on 08/21/2019, at which time Paula Fuller reported significant increase in Paula Fuller stress.  Paula Fuller was unable to tolerate Celexa in Paula recent past and had also tried Lexapro.  In Paula distant past Paula Fuller had tried Prozac and we mutually agreed to try low-dose Prozac and for anxiety Paula suggested BuSpar and low-dose with gradual increase, as needed.  Paula Fuller was advised to continue with Paula Fuller Sinemet.  Paula Fuller declined a referral to psychiatry.  Paula Fuller also declined a referral for a second opinion for Paula Fuller Parkinson's disease.   Today, 11/19/2019: Paula Fuller reports that Paula Fuller mood is better, Prozac has helped, Paula Fuller is on 10 mg once daily and tolerates it.  Paula did take Paula Fuller a little bit of time to get used to taking it, Paula Fuller had some GI related side effects but is stable on it now.  Paula Fuller has taken BuSpar very infrequently maybe a handful of times in these past 3 months and it helped a little bit for situational anxiety.  Paula Fuller continues to take Sinemet 4 times a day, tries to be at 4 hourly intervals but has taken it closer than that because of flareup and tremor, Paula Fuller believes that Paula Sinemet lasts Paula Fuller about 3 hours on average, Paula Fuller starts Paula Fuller first dose at 6 AM.  Paula Fuller last dose is therefore at 6 PM and Paula Fuller does have a hard time getting through Paula night with Paula Fuller motor symptoms and first thing in Paula morning Paula Fuller feels worse.  Weight is stable, has lost a little bit of weight and feels better.  Stress level is a little better but stress is still there.  Paula Fuller's allergies, current medications, family history, past medical history, past social history,  past surgical history and problem list were reviewed and updated as appropriate.    Previously:   Paula saw Paula Fuller on 05/21/2019, at which time Paula Fuller felt improvement with Paula Sinemet.  Paula Fuller was tolerating it okay.  Paula Fuller had improvement in Paula Fuller headaches after Paula Fuller stopped Paula Neupro patch.  Paula Fuller had significant issues with anxiety and also depression.  Paula Fuller had weight gain in Paula past with sertraline.  Paula suggested a trial of Celexa.  Paula also suggested Paula Fuller increase Paula Sinemet to 4 times a day.     Paula Fuller emailed in Paula interim in early December 2020 indicating that Paula Sinemet 4 times a day felt too much, Paula Fuller felt dizzy.  Paula Fuller was advised to reduce it back to 3 times daily.  Paula Fuller also reported side effects from Paula Celexa including nausea and vomiting and appetite loss and was therefore advised to stop it.         Paula saw Paula Fuller on 02/17/2019, at which time Paula Fuller reported no significant results from Paula Neupro, had ongoing issues with weight gain and swelling and also felt that Paula Fuller had developed some headaches after starting Paula Neupro patch.  Paula Fuller reported dream enactment behaviors.  Paula suggested Paula Fuller stop Paula Neupro and start a trial of Sinemet.  In addition, Paula Fuller was advised to start clonazepam at night.  Paula Fuller called in Paula interim reporting that Paula Fuller PCP did not  want Paula Fuller on both Xanax and clonazepam and we have discussed this as well.  Paula Fuller was advised to discuss this matter with Paula Fuller primary care physician and see if Paula Fuller could go without Paula Xanax during Paula day and continue with Paula clonazepam at night.    Paula saw Paula Fuller on 11/21/2018 in a virtual visit, at which time Paula Fuller reported side effects with Mirapex including weight gain and not sleeping well at night.  Paula suggested we switch Paula Fuller over to Paula Neupro patch, 2 mg strength.   Paula saw Paula Fuller on 06/26/2018, At which time Paula Fuller reported feeling better, Paula Fuller reported that Paula Mirapex was helpful.  Paula Fuller had stopped a new antidepressant and started generic Celexa.  Paula Fuller anxiety was better.  Paula Fuller was able  to tolerate Paula Mirapex.   Paula saw Paula Fuller on 03/27/2018, at which time Paula Fuller reported still struggling with Paula Fuller anxiety and depressive symptoms. We talked about Paula Fuller DaT scan results. Paula Fuller had tried multiple different antidepressants in Paula past, Paula Fuller was given most recently a sample for Trintellix by Paula Fuller PCP but had not started it. Paula Fuller was advised to start it.   Paula Fuller was also advised to start Mirapex with gradual increase. Paula Fuller called in Paula interim in November 2019 reporting that Mirapex was not making a big difference. Paula Fuller was advised that Paula Fuller was on a low-dose of advised to increase it. Paula Fuller had also called in September 2019 reporting that Paula Fuller was not able to tolerate Paula antidepressant that was given to Paula Fuller by Paula Fuller PCP. Paula Fuller was advised to talk to Paula Fuller family doctor about alternative treatment options for anxiety and depression. Paula Fuller had reduced Paula Fuller Xanax.     Paula Fuller on 12/13/2017 at Paula request of Paula Fuller primary care provider, at which time Paula Fuller reported a bilateral hand tremor of at least 2 years duration as well as fine motor dyscontrol. Right-sided symptoms were worse on Paula left. Paula Fuller examination was concerning for parkinsonism with right-sided lateralization. Paula suggested we proceed with a brain MRI and we subsequently also proceeded with a DaT scan.  Paula Fuller had a brain MRI with and without contrast on 12/27/2017 and Paula reviewed Paula results: IMPRESSION: This MRI of Paula brain with and without contrast shows Paula following: 1.    There are some scattered T2/FLAIR hyperintense foci consistent with mild age-related chronic microvascular ischemic changes 2.    There are no acute findings and there is a normal enhancement pattern.   We called Paula Fuller with Paula Fuller test results.  Paula Fuller had a nuclear medicine DaT scan on 03/21/2018 and a very tight results: IMPRESSION: Near absent radiotracer activity within Paula bilateral posterior striata (putamen) in a pattern typical of a Parkinson's syndrome. pathology. There is mild  decreased relative activity in Paula head of Paula LEFT caudate nucleus compared to Paula RIGHT.   We called Paula Fuller with Paula Fuller test results and scheduled Paula Fuller for a sooner than scheduled appointment.   12/13/2017: (Paula Fuller) reports a right more than left hand tremor for Paula past 2 years, worse in Paula past few months. Paula Fuller has noticed other issues including fine motor dyscontrol, balance issues, mild short-term memory loss. Paula Fuller has a history of loss of smell for Paula past 10-12 years. Paula Fuller has never considered seeing ENT for this. Paula Fuller has suboptimally controlled anxiety. Paula Fuller has been on Lexapro in Paula past which caused significant GI related side effects and for Paula past 2 months Paula Fuller has been on sertraline. Paula Fuller does not feel that Paula sertraline  has been helpful. Paula Fuller has been on Xanax off and on since Paula late 90s when Paula Fuller had to take care of Paula Fuller father. Paula Fuller has been on Xanax consistently 3 times a day for Paula past couple of years, since 2016 or so. Paula Fuller is currently on sertraline 100 mg daily and alprazolam 0.5 mg 3 times a day. Paula Fuller has had significant weight loss and had multiple tests for this including endoscopy and CAT scan all per Paula Fuller report without significant abnormality is thankfully. Paula Fuller is using some protein milkshakes to supplement Paula Fuller diet. Paula Fuller tremor is at rest as well as with activity at times. It is more noticeable on Paula right, Paula Fuller has also noticed difficulty with fine motor tasks also more on Paula right. Paula Fuller fell recently once in Paula yard, as Paula Fuller was standing up from Paula squatting position and thankfully did not hurt herself.Paula reviewed your office note from 10/29/2017. Paula Fuller has no family history of tremors but reports a family history of Parkinson's disease, Paula Fuller father's first cousin apparently had Parkinson's disease, another first cousin of Paula Fuller father had some other neurological disease, maybe MS. Paula Fuller has worried about MS. Paula Fuller saw a neurologist in [place], [place] in August 2018 told Paula Fuller Paula Fuller does not have MS.  Paula Fuller is a nonsmoker and does not drink alcohol and drinks caffeine one serving per day on average. Paula Fuller is married and lives with Paula Fuller husband, they have 1 daughter. 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  Paula Fuller Past Medical History Is Significant For: Past Medical History:  Diagnosis Date  . Anxiety disorder   . GERD (gastroesophageal reflux disease)   . High cholesterol   . IBS (irritable bowel syndrome)   . Insomnia     Paula Fuller Past Surgical History Is Significant For: Past Surgical History:  Procedure Laterality Date  . CHOLECYSTECTOMY      Paula Fuller Family History Is Significant For: No family history on file.  Paula Fuller Social History Is Significant For: Social History   Socioeconomic History  . Marital status: Married    Spouse name: Not on file  . Number of children: Not on file  . Years of education: Not on file  . Highest education level: Not on file  Occupational History  . Not on file  Tobacco Use  . Smoking status: Never Smoker  . Smokeless tobacco: Never Used  Substance and Sexual Activity  . Alcohol use: Never  . Drug use: Never  . Sexual activity: Not on file  Other Topics Concern  . Not on file  Social History Narrative  . Not on file   Social Determinants of Health   Financial Resource Strain:   . Difficulty of Paying Living Expenses:   Food Insecurity:   . Worried About Charity fundraiser in Paula Last Year:   . Arboriculturist in Paula Last Year:   Transportation Needs:   . Film/video editor (Medical):   Marland Kitchen Lack of Transportation (Non-Medical):   Physical Activity:   . Days of Exercise per Week:   . Minutes of Exercise per Session:   Stress:   . Feeling of Stress :   Social Connections:   . Frequency of Communication with Friends and Family:   . Frequency of Social Gatherings with Friends and Family:   . Attends Religious Services:   . Active Member of Clubs or Organizations:   . Attends English as a second language teacher Meetings:   .  Marital Status:     Paula Fuller Allergies Are:  No Known Allergies:   Paula Fuller Current Medications Are:  Outpatient Encounter Medications as of 11/19/2019  Medication Sig  . busPIRone (BUSPAR) 5 MG tablet Take 1 tablet (5 mg total) by mouth 3 (three) times daily as needed.  . carbidopa-levodopa (SINEMET IR) 25-100 MG tablet Take 1 tablet by mouth 4 (four) times daily. Take at 7, 11, 3 PM and 7 PM.  . clonazePAM (KLONOPIN) 0.5 MG disintegrating tablet Take 1 tablet (0.5 mg total) by mouth at bedtime.  Marland Kitchen FLUoxetine (PROZAC) 10 MG capsule Take 1 capsule (10 mg total) by mouth daily.  . [DISCONTINUED] FLUoxetine (PROZAC) 10 MG capsule Take 1 capsule (10 mg total) by mouth daily.  . carbidopa-levodopa (SINEMET CR) 50-200 MG tablet Take 1 tablet by mouth at bedtime. Take at 9 PM (1-2 h before bedtime)   No facility-administered encounter medications on file as of 11/19/2019.  :  Review of Systems:  Out of a complete 14 point review of systems, all are reviewed and negative with Paula exception of these symptoms as listed below:  Review of Systems  Neurological:       Here for 3 month f/u. Reports Paula Fuller is doing better since Paula Fuller last o/v. Paula Fuller sts Paula Fuller can still tell when Paula sinemet is wearing off. Paula Fuller typically notices about 2 1/2 to 3 hour mark.     Objective:  Neurological Exam  Physical Exam Physical Examination:   Vitals:   11/19/19 0920  BP: 120/82  Pulse: 80  Temp: (!) 97.2 F (36.2 C)    General Examination: Paula Fuller is a very pleasant 69 y.o. female in no acute distress. Paula Fuller appears well-developed and well-nourished and well groomed. Less anxious.  HEENT:Normocephalic, atraumatic, pupils are equal, round and reactive to light and accommodation. Paula Fuller has corrective eyeglasses, extraocular tracking is fairly well-preserved, moderate facial masking is noted. No significant lip or jaw tremor is noted today. Airway examination is stable. Paula Fuller has minimal  hypophonia, no dysarthria, mild nuchal rigidity, which is stable.  Chest:Clear to auscultation without wheezing, rhonchi or crackles noted.  Heart:S1+S2+0, regular and normal without murmurs, rubs or gallops noted.   Abdomen:Soft, non-tender and non-distended with normal bowel sounds appreciated on auscultation.  Extremities:There is no pitting edema in Paula distal lower extremities bilaterally.   Skin: Warm and dry without trophic changes noted.   Musculoskeletal: exam reveals no obvious joint deformities, tenderness or joint swelling or erythema.   Neurologically:  Mental status: Paula Fuller is awake, alert and oriented in all 4 spheres. Paula Fuller immediate and remote memory, attention, language skills and fund of knowledge are appropriate. There is no evidence of aphasia, agnosia, apraxia or anomiawith some slowness in thinking at times,Speech is clear with normal prosody and enunciation. Thought process is linear. Mood is normal and affect isgood.  Cranial nerves II - XII are as described above under HEENT exam. In addition: shoulder shrug is normal, left shoulder slightly higher. Motor exam: Normal bulk, and strength are noted. Paula Fuller has mild increase in tone in Paula right upper extremity with slight cogwheeling noted, stable. There is a consistentresting tremor in Paula right hand. Overall mild bradykinesia.  (On 12/13/2017: On Archimedes spiral drawing Paula Fuller has slight insecurity with both hands, no obvious tremor though. Handwriting is legible, not particularly tremulous, but mildly micrographic.)  Paula Fuller has a slight postural tremor in both upper extremities, Paula Fuller has no significant action tremor, no intention tremor. On fine motor testing Paula Fuller has  mild to moderate difficulty on Paula right and better on Paula left.  Cerebellar testing: No dysmetria or intention tremor.There is no truncal or gait ataxia.Heel-to-shin good bilaterally. Sensory exam: intact to light touch.  Gait,  station and balance: Paula Fuller stands without difficulty, posture is age-appropriate, left shoulder perhaps slightly higher than right. Paula Fuller walks with a normal stride length and fairly good pace, decrease in arm swing noted on Paula rightmore than L. Paula Fuller turns well, balance is preserved.  Assessmentand Plan:  In summary, Paula Fuller is a very pleasant 69 year old female ith an underlying medical history of anxiety, depression, irritable bowel syndrome,weight loss, prior history of hyperlipidemia and reflux disease, who presents forFUconsultation of herparkinsonism with symptoms dating back to about2-3years ago with right hand tremor as one of Paula Fuller symptoms. Paula Fuller history, examination and DaT scan in 09/19 support Paula diagnosis of right-sided predominantParkinson's disease. Paula Fuller brain MRI showed age-appropriate findings in June 2019.Shestarted treatment with Mirapex low-dose and initially did quite well, noticed an improvement in Paula Fuller tremor and Paula Fuller exam had improved. However, shewashaving more trouble with taking 0.25 mg strength 2 pills 3 times daily. Paula Fuller had also gained weight. Paula Fuller was advised to discontinue Mirapex and start Neupro patch in May 2020. Paula Fuller did not have telltale results from Paula Neupro, had ongoing issues with swelling and weight gain, has also noticed some headaches after starting Paula Neupro. We stopped Paula Neupro patch and Paula Fuller started Sinemet in August 2020.  Paula Fuller has been taking 1 pill 4 times a day.  Paula Fuller has had some dizziness from it.  Paula Fuller has been on clonazepam 0.5 mg each night since October.  Paula Fuller had been on Xanax before but no longer takes Paula Xanax.  Paula Fuller has struggled with anxiety and depression for years.  Paula Fuller has been on several different medications, could not tolerate Celexa which we tried, and has been on low dose Prozac since February as well as BuSpar 5 mg up to 3 times daily as needed, but used it very rarely.  Paula Fuller is advised to utilize Paula BuSpar not to cautiously and Paula Fuller  could take it more consistently if need be for flareup of Paula Fuller anxiety.  Paula Fuller seems to tolerate it.  Paula Fuller is advised to continue with Paula clonazepam at Paula current dose.  Paula Fuller is advised to consider several options to help Paula Fuller with Paula Fuller motor symptoms.  For now, Paula Fuller biggest issue is Paula evening and motor symptoms through Paula night and first thing in Paula morning, therefore, Paula suggested Paula Fuller continue with Paula Fuller Sinemet 4 hourly during Paula day and add Sinemet CR 50-200 mg strength at 9 PM.  Paula Fuller typically goes to bed between 10 and 11.  As a next step, we could bring Paula Sinemet IR closer together for a total of 5 doses every 3 hours, namely at 6, 9, 12, 3 PM and 6 PM.  Also, we can consider adding Comtan to Paula Fuller Sinemet IR, either for 4 doses altogether or 2 out of 4 doses or alternating for 3 out of 5 doses.  These are all considerations we can talk about next time.  For now, we will add Sinemet CR and keep all Paula meds Paula same.  Paula answered all Paula Fuller questions today and Paula Fuller was in agreement. Paula spent 30 minutes in total face-to-face time and in reviewing records during pre-charting, more than 50% of which was spent in counseling and coordination of care, reviewing test results, reviewing medications and treatment regimen and/or in discussing or  reviewing Paula diagnosis of PD, Paula prognosis and treatment options. Pertinent laboratory and imaging test results that were available during this visit with Paula Fuller were reviewed by me and considered in my medical decision making (see chart for details).

## 2019-11-19 NOTE — Patient Instructions (Signed)
As discussed, there are several things we can consider to help you with your motor symptoms and reduce the tremor.  For now, I would like for you to keep your Sinemet IR at 1 pill 4 times a day at 6, 10, 2 PM and 6 PM.   We will add Sinemet CR at night, around 9 PM, which is a long acting formulation of levodopa.  This is in the hope that you will have better motor control in the evening and through the night. We can consider for our next steps a couple of additional things including bringing your Sinemet IR dose closer together in 3 hourly intervals such as 6, 9, 12 PM, 3 PM and 6 PM which would add 1/5 dose during the day.  Also, for later consideration, we can add a medication called Comtan (generic name is Entacapone) 200 mg Strength as an add-on to your Sinemet doses, either with 2 of the 4 doses or all 4 (5) doses eventually.   This is an enzyme inhibitor, which Inhibits the mechanism of a enzyme called COMT, and as such alters the plasma pharmacokinetics of levodopa. In essence, the result is that you may have more levodopa in your system which then gets transformed to dopamine. We will pick up our discussion next time.

## 2020-02-19 ENCOUNTER — Ambulatory Visit: Payer: Medicare PPO | Admitting: Neurology

## 2020-03-09 ENCOUNTER — Telehealth: Payer: Self-pay | Admitting: Neurology

## 2020-03-09 ENCOUNTER — Encounter: Payer: Self-pay | Admitting: Neurology

## 2020-03-09 ENCOUNTER — Other Ambulatory Visit: Payer: Self-pay

## 2020-03-09 MED ORDER — CLONAZEPAM 0.5 MG PO TBDP: 0.5000 mg | ORAL_TABLET | Freq: Every day | ORAL | 1 refills | Status: DC

## 2020-03-09 NOTE — Telephone Encounter (Signed)
I called the pt and advised the message she received was the refill request for clonazepam.  Pt verbalized understanding. I advised this rx is pending the Doctors signature.

## 2020-03-09 NOTE — Telephone Encounter (Signed)
Pt called, email is not working and can't get to MyChart to see a message.

## 2020-03-09 NOTE — Telephone Encounter (Signed)
Pt is asking for a refill on Clonazepam. [place] drug registry has been verified.  Last refill was 02/06/2020 # 30 for 30 day.

## 2020-04-01 ENCOUNTER — Ambulatory Visit: Payer: Medicare PPO | Admitting: Neurology

## 2020-04-15 ENCOUNTER — Telehealth: Payer: Self-pay | Admitting: Neurology

## 2020-04-15 ENCOUNTER — Encounter: Payer: Self-pay | Admitting: Neurology

## 2020-04-15 NOTE — Telephone Encounter (Signed)
I have reached out to the pt in regards to the message. Waiting to hear back

## 2020-04-15 NOTE — Telephone Encounter (Signed)
Please call patient regarding her MyChart message.  She is supposed to take Sinemet 1 pill 4 times a day and the Sinemet CR at bedtime which we started in May at her last appointment.   Is she still taking these medications as prescribed?  Also, for her active dreams/ dream enactment behaviors she has been on clonazepam.  Is she off of it or is she still on it?  If she is still on it, I would recommend that we increase the dose to 1 mg rather than 0.5 mg each night.  She has already tried 2 other medications for her Parkinson's disease.   We had talked about adding Comtan to her Sinemet which would help extend the duration of her individual dose.  I would just like to make sure we know what she is exactly taking at this time and then potentially add the Comtan to her Sinemet 4 times a day.

## 2020-04-15 NOTE — Telephone Encounter (Addendum)
Pt responded to me via my chart and confirmed her current medication dosages as stated below.  I am taking Sinement 25-100mg  4 times a day. I am not taking any other medication. All of this started after my May 6th visit when she added the Sinement CR and the clonazepam and prozac. I took all she prescribed from May 6 until July 15. I had night episodes just about every night . I thought one if the added meds was causing this so I continued to take only the Sinement 25-100 . Thanks ! Is there any way she can see me before  October 11th? I can go back to taking clonazepam .5 mg at bedtime since I am still having night issues and know the drug is not causing the problem.  I spoke with Dr. Frances Furbish verbally about these messages. Her recommendation right now is for the pt to start back the sinemet CR 50-200 mg at bedtime and clonazepam 0.5 mg at night. She also recommends pt keep her appt as scheduled for 04/26/2020 so we could further discuss next steps and treatment options.  Pt was advised of this via my chart and was also advised we would monitor the schedule for any sooner appointments.

## 2020-04-26 ENCOUNTER — Other Ambulatory Visit: Payer: Self-pay

## 2020-04-26 ENCOUNTER — Ambulatory Visit: Payer: Medicare PPO | Admitting: Neurology

## 2020-04-26 ENCOUNTER — Encounter: Payer: Self-pay | Admitting: Neurology

## 2020-04-26 VITALS — BP 118/82 | HR 78 | Ht 62.0 in | Wt 135.3 lb

## 2020-04-26 DIAGNOSIS — G4752 REM sleep behavior disorder: Secondary | ICD-10-CM

## 2020-04-26 DIAGNOSIS — F419 Anxiety disorder, unspecified: Secondary | ICD-10-CM

## 2020-04-26 DIAGNOSIS — G479 Sleep disorder, unspecified: Secondary | ICD-10-CM | POA: Diagnosis not present

## 2020-04-26 DIAGNOSIS — G20 Parkinson's disease: Secondary | ICD-10-CM

## 2020-04-26 DIAGNOSIS — F32A Depression, unspecified: Secondary | ICD-10-CM

## 2020-04-26 MED ORDER — CARBIDOPA-LEVODOPA 25-100 MG PO TABS: 1.0000 | ORAL_TABLET | Freq: Every day | ORAL | 3 refills | Status: DC

## 2020-04-26 MED ORDER — CLONAZEPAM 1 MG PO TBDP: 1.0000 mg | ORAL_TABLET | Freq: Every day | ORAL | 1 refills | Status: DC

## 2020-04-26 MED ORDER — BUSPIRONE HCL 5 MG PO TABS: 5.0000 mg | ORAL_TABLET | Freq: Three times a day (TID) | ORAL | 5 refills | Status: DC | PRN

## 2020-04-26 NOTE — Progress Notes (Signed)
Subjective:    Patient ID: Paula Fuller is a 69 y.o. female.  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 The patient is unaccompanied today. I last saw her on 11/19/19, At which time she reported that her mood was better.  Prozac was helping, she was on 10 mg strength.  BuSpar was helping for situational anxiety and she was taking it infrequently.  She was on Sinemet 4 times a day.  She reported that she was having a harder time getting through the night with her motor symptoms flaring up in the middle of the night.  She was advised to continue with all her medications but add Sinemet CR at bedtime.  We talked about adding Comtan next.  We also talked about the possibility of just moving her Sinemet IR closer together to add a 5th dose to her regimen.  She emailed recently in September 2021 reporting that she had stopped most of her medications for concern for side effects. She reported having more dream enactment behavior as witnessed by her husband.  She thought that these became worse in the previous 3 months and therefore tapered herself off of most of her meds. She had stopped the Sinemet CR, Prozac, clonazepam.  She was advised to start back on the Sinemet CR and clonazepam and continue with Sinemet IR.  Today, 05/07/20: She reports  that she feels a little better.  She reports that she had dream enactment behavior nearly nightly, before she weaned herself off of all her medications but does admit that after she restart the clonazepam and the Sinemet CR she had less dramatic dream enactment.  At one point she was yelling so loud that her husband could hear her downstairs on the other side of the house when she was already sleeping  upstairs.  She has no real dream recollection.  She is now reported to have some mumbling and minor movements but she was thrashing and kicked things off the nightstand even.  She started having GI related side effects with the Prozac and feels better after stopping it.  She currently takes the Sinemet 4 times a day and keeps track of it in a journal.  She takes this ER around 9 PM and clonazepam 0.5 mg strength at bedtime.  She has used the BuSpar sparingly, maybe 3 or 4 times a week 1 pill at a time, she does not take it daily and certainly not multiple times a day.  She does believe it helps.  She has taken a fall recently about 3 weeks ago.  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  She is doing well in that regard but did take a fall and thankfully did not injure herself.  She admits that she was wearing her favorite flip-flops, she is wearing them today as well.   The patient's allergies, current medications, family history, past medical history, past social history, past surgical history and problem list were reviewed and updated as appropriate.    Previously:   I saw her on 08/21/2019, at which time she reported significant increase in her stress.  She was unable to tolerate Celexa in the recent past and had  also tried Lexapro.  In the distant past she had tried Prozac and we mutually agreed to try low-dose Prozac and for anxiety I suggested BuSpar and low-dose with gradual increase, as needed.  She was advised to continue with her Sinemet.  She declined a referral to psychiatry.  She also declined a referral for a second opinion for her Parkinson's disease.        I saw her on 05/21/2019, at which time she felt improvement with the Sinemet.  She was tolerating it okay.  She had improvement in her headaches after she stopped the Neupro patch.  She had significant issues with anxiety and also depression.  She had weight gain in the  past with sertraline.  I suggested a trial of Celexa.  I also suggested she increase the Sinemet to 4 times a day.     She emailed in the interim in early December 2020 indicating that the Sinemet 4 times a day felt too much, she felt dizzy.  She was advised to reduce it back to 3 times daily.  She also reported side effects from the Celexa including nausea and vomiting and appetite loss and was therefore advised to stop it.         I saw her on 02/17/2019, at which time she reported no significant results from the Neupro, had ongoing issues with weight gain and swelling and also felt that she had developed some headaches after starting the Neupro patch.  She reported dream enactment behaviors.  I suggested she stop the Neupro and start a trial of Sinemet.  In addition, she was advised to start clonazepam at night.  She called in the interim reporting that her PCP did not want her on both Xanax and clonazepam and we have discussed this as well.  She was advised to discuss this matter with her primary care physician and see if she could go without the Xanax during the day and continue with the clonazepam at night.    I saw her on 11/21/2018 in a virtual visit, at which time she reported side effects with Mirapex including weight gain and not sleeping well at night.  I suggested we switch her over to the Neupro patch, 2 mg strength.   I saw her on 06/26/2018, At which time she reported feeling better, she reported that the Mirapex was helpful.  She had stopped a new antidepressant and started generic Celexa.  Her anxiety was better.  She was able to tolerate the Mirapex.   I saw her on 03/27/2018, at which time she reported still struggling with her anxiety and depressive symptoms. We talked about her DaT scan results. She had tried multiple different antidepressants in the past, she was given most recently a sample for Trintellix by her PCP but had not started it. She was advised to start it.   She was also  advised to start Mirapex with gradual increase. She called in the interim in November 2019 reporting that Mirapex was not making a big difference. She was advised that she was on a low-dose of advised to increase it. She had also called in September 2019 reporting that she was not able to tolerate the antidepressant that was given to her by her PCP. She was advised to talk to her family doctor about alternative treatment options for anxiety and depression. She had reduced her Xanax.     I first met her on 12/13/2017 at the request of her primary care provider, at which time  she reported a bilateral hand tremor of at least 2 years duration as well as fine motor dyscontrol. Right-sided symptoms were worse on the left. Her examination was concerning for parkinsonism with right-sided lateralization. I suggested we proceed with a brain MRI and we subsequently also proceeded with a DaT scan.  She had a brain MRI with and without contrast on 12/27/2017 and I reviewed the results: IMPRESSION: This MRI of the brain with and without contrast shows the following: 1.    There are some scattered T2/FLAIR hyperintense foci consistent with mild age-related chronic microvascular ischemic changes 2.    There are no acute findings and there is a normal enhancement pattern.   We called her with her test results.  She had a nuclear medicine DaT scan on 03/21/2018 and a very tight results: IMPRESSION: Near absent radiotracer activity within the bilateral posterior striata (putamen) in a pattern typical of a Parkinson's syndrome. pathology. There is mild decreased relative activity in the head of the LEFT caudate nucleus compared to the RIGHT.   We called her with her test results and scheduled her for a sooner than scheduled appointment.   12/13/2017: (She) reports a right more than left hand tremor for the past 2 years, worse in the past few months. She has noticed other issues including fine motor dyscontrol, balance  issues, mild short-term memory loss. 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 She has never considered seeing ENT for this. She has suboptimally controlled anxiety. She has been on Lexapro in the past which caused significant GI related side effects and for the past 2 months she has been on sertraline. She does not feel that the sertraline has been helpful. She has been on Xanax off and on since the late 90s when she had to take care of her father. She has been on Xanax consistently 3 times a day for the past couple of years, since 2016 or so. She is currently on sertraline 100 mg daily and alprazolam 0.5 mg 3 times a day. She has had significant weight loss and had multiple tests for this including endoscopy and CAT scan all per her report without significant abnormality is thankfully. She is using some protein milkshakes to supplement her diet. Her tremor is at rest as well as with activity at times. It is more noticeable on the right, she has also noticed difficulty with fine motor tasks also more on the right. She fell recently once in the yard, as she was standing up from the squatting position and thankfully did not hurt herself.I reviewed your office note from 10/29/2017. She has no family history of tremors but reports a family history of Parkinson's disease, her father's first cousin apparently had Parkinson's disease, another first cousin of her father had some other neurological disease, maybe MS. She has worried about MS. She saw a neurologist in [place], [place] in August 2018 told her she does not have MS. She is a nonsmoker and does not drink alcohol and drinks caffeine one serving per day on average. She is married and lives with her husband, they have 1 daughter. 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  Her Past Medical History Is Significant For: Past Medical History:  Diagnosis Date  . Anxiety disorder   .  GERD (gastroesophageal reflux disease)   . High cholesterol   . IBS (irritable bowel syndrome)   . Insomnia  Her Past Surgical History Is Significant For: Past Surgical History:  Procedure Laterality Date  . CHOLECYSTECTOMY      Her Family History Is Significant For: No family history on file.  Her Social History Is Significant For: Social History   Socioeconomic History  . Marital status: Married    Spouse name: Not on file  . Number of children: Not on file  . Years of education: Not on file  . Highest education level: Not on file  Occupational History  . Not on file  Tobacco Use  . Smoking status: Never Smoker  . Smokeless tobacco: Never Used  Substance and Sexual Activity  . Alcohol use: Never  . Drug use: Never  . Sexual activity: Not on file  Other Topics Concern  . Not on file  Social History Narrative  . Not on file   Social Determinants of Health   Financial Resource Strain:   . Difficulty of Paying Living Expenses: Not on file  Food Insecurity:   . Worried About Charity fundraiser in the Last Year: Not on file  . Ran Out of Food in the Last Year: Not on file  Transportation Needs:   . Lack of Transportation (Medical): Not on file  . Lack of Transportation (Non-Medical): Not on file  Physical Activity:   . Days of Exercise per Week: Not on file  . Minutes of Exercise per Session: Not on file  Stress:   . Feeling of Stress : Not on file  Social Connections:   . Frequency of Communication with Friends and Family: Not on file  . Frequency of Social Gatherings with Friends and Family: Not on file  . Attends Religious Services: Not on file  . Active Member of Clubs or Organizations: Not on file  . Attends Archivist Meetings: Not on file  . Marital Status: Not on file    Her Allergies Are:  Allergies  Allergen Reactions  . Prozac [Fluoxetine] Other (See Comments)    Upset Stomach  :   Her Current Medications Are:  Outpatient  Encounter Medications as of 04/26/2020  Medication Sig  . busPIRone (BUSPAR) 5 MG tablet Take 1 tablet (5 mg total) by mouth 3 (three) times daily as needed.  . carbidopa-levodopa (SINEMET CR) 50-200 MG tablet Take 1 tablet by mouth at bedtime. Take at 9 PM (1-2 h before bedtime)  . carbidopa-levodopa (SINEMET IR) 25-100 MG tablet Take 1 tablet by mouth 4 (four) times daily. Take at 7, 11, 3 PM and 7 PM.  . clonazePAM (KLONOPIN) 0.5 MG disintegrating tablet Take 1 tablet (0.5 mg total) by mouth at bedtime.  . [DISCONTINUED] FLUoxetine (PROZAC) 10 MG capsule Take 1 capsule (10 mg total) by mouth daily. (Patient not taking: Reported on 04/26/2020)   No facility-administered encounter medications on file as of 04/26/2020.  :  Review of Systems:  Out of a complete 14 point review of systems, all are reviewed and negative with the exception of these symptoms as listed below: Review of Systems  Neurological:       Here for f/u on PD. Pt reports she has been doing ok since adding the night time clonazepam and sinemet CR back. She still reports trouble resting. She reports most days she wakes up between 4 and 5 am and will struggle with going back to sleep. She d/c prozac due to upset stomach side effects. Also reports increased right sided tremors since last appt.     Objective:  Neurological  Exam  Physical Exam Physical Examination:   Vitals:   04/26/20 0936  BP: 118/82  Pulse: 78  SpO2: 96%   General Examination: The patient is a very pleasant 69 y.o. female in no acute distress. She appears well-developed and well-nourished and well groomed.   HEENT:Normocephalic, atraumatic, pupils are equal, round and reactive to light and accommodation. She has corrective eyeglasses, extraocular tracking is fairly well-preserved, moderatefacial masking is noted. No significant lip or jaw tremor is noted today. Airway examination is stable. She has minimal hypophonia, no dysarthria, mild nuchal  rigidity, which is stable.  Chest:Clear to auscultation without wheezing, rhonchi or crackles noted.  Heart:S1+S2+0, regular and normal without murmurs, rubs or gallops noted.   Abdomen:Soft, non-tender and non-distended.  Extremities:There is no pitting edema in the distal lower extremities bilaterally.   Skin: Warm and dry without trophic changes noted.   Musculoskeletal: exam reveals no obvious joint deformities, tenderness or joint swelling or erythema.   Neurologically:  Mental status: The patient is awake, alert and oriented in all 4 spheres. Her immediate and remote memory, attention, language skills and fund of knowledge are appropriate. There is no evidence of aphasia, agnosia, apraxia or anomiawith some slowness in thinking at times,Speech is clear with normal prosody and enunciation. Thought process is linear. Mood is normal and affect isgood.  Cranial nerves II - XII are as described above under HEENT exam. In addition: shoulder shrug is normal, left shoulder slightly higher. Motor exam: Normal bulk, and strength are noted. She has mild increase in tone in the right upper extremity. There is a consistentresting tremor in the right hand. Overall mild bradykinesia.  (On 12/13/2017: On Archimedes spiral drawing she has slight insecurity with both hands, no obvious tremor though. Handwriting is legible, not particularly tremulous, but mildly micrographic.)  She has a slight postural tremor in both upper extremities, she has no significant action tremor, no intention tremor. On fine motor testing,she has mild to moderate difficulty on the right and better on the left. Cerebellar testing: No dysmetria or intention tremor.There is no truncal or gait ataxia.Heel-to-shin good bilaterally. Sensory exam: intact to light touch.  Gait, station and balance: She stands without difficulty, posture is age-appropriate, left shoulder perhaps slightly higher than right. She  walks with a slightly decreased stride length and good pace, decreased arm swing on the right more than left.  She turns quite well, balance is preserved.  She is wearing flip-flops today.    Assessmentand Plan:  In summary, Paula Fuller is a very pleasant 69 year old female ith an underlying medical history of anxiety, depression, irritable bowel syndrome,weight loss, prior history of hyperlipidemia and reflux disease, who presents forFUconsultation of herparkinsonism with symptoms dating back to about3years ago with right hand tremor as one of her symptoms. Her history, examination and DaT scan in 09/19 support the diagnosis of right-sided predominantParkinson's disease. Her brain MRI showed age-appropriate findings in June 2019.Shestarted treatment with Mirapex low-dose and initially did quite well, noticed an improvement in her tremor and her exam had improved. However, shewashaving more trouble with taking 0.25 mg strength 2 pills 3 times daily. She had also gained weight. She was advised to discontinue Mirapex and start Neupro patch in May 2020. Shedid not havetelltale results from the Neupro, had ongoing issues with swelling and weight gain, has also noticed some headaches after starting the Neupro.We stopped the Neupro patch and she started Sinemet in August 2020. She has been taking 1 pill 4 times a day. She  has had some dizziness from it. She has been on clonazepam 0.5 mg each night since October 2020. She had been on Xanax before but no longer takes the Xanax. She has struggled with anxiety and depression for years. She has been on several different medications, could not tolerate Celexa which we tried, and has been on low dose Prozac since February 2021, but recently tapered off of it due to GI side effects.  She also started having increased dream enactment behavior.  She continues to take BuSpar 5 mg strength as needed but takes it sparingly.  She is advised to take the  BuSpar once a day at this point especially since she is off of Prozac.  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  She is advised to increase her clonazepam to 1 mg strength, 1 pill each night.  I adjusted her prescription in that regard and she is willing to increase it.  She has had more motor difficulty.  We talked about different options last time in May 2021 and I reiterated some of this today.  We could add Comtan at this point, at least for 2 of the 4 doses or increase the frequency of her Sinemet IR to 1 pill 5 times a day, 3 hourly as opposed to 4 hourly.  She would like to increase the Sinemet to 5 times a day at this time rather than adding 1 more medication.  To that end, she is advised to take Sinemet 1 pill 5 times a day at 3 hourly intervals, starting at 7 AM.  She is advised to continue with the CR at bedtime, we started this in May 2021.  We talked about the importance of fall prevention.  She is discouraged from wearing flip-flops.  She is advised to follow-up in 3 months, sooner if needed.  I answered all her questions today and she was in agreement with the plan. I spent 40 minutes in total face-to-face time and in reviewing records during pre-charting, more than 50% of which was spent in counseling and coordination of care, reviewing test results, reviewing medications and treatment regimen and/or in discussing or reviewing the diagnosis of PD, the prognosis and treatment options. Pertinent laboratory and imaging test results that were available during this visit with the patient were reviewed by me and considered in my medical decision making (see chart for details).

## 2020-04-26 NOTE — Patient Instructions (Addendum)
It was good to see you again today.  I am glad to hear that you are feeling a little better.  Here is what we discussed for your plan today:  1.  Take BuSpar daily at least once a day for anxiety management, you can take 1 pill in the late morning but you can go up to 1 pill 3 times a day as needed for anxiety.  I renewed your prescription.  2.  For your dream enactment behavior, called REM behavior disorder, I recommend that you increase your clonazepam to 1 mg strength, take 1 pill each bedtime daily.  I have adjusted your prescription.   3.  We will try an increased frequency of Sinemet, take 1 pill 5 times a day at 7 AM, 10 AM, 1 PM, 4 PM and 7 PM daily and continue with your long-acting Sinemet at bedtime.  I adjusted your Sinemet prescription and you did not need a refill on the long-acting one.   4.  Follow-up in 3 months.

## 2020-05-12 ENCOUNTER — Ambulatory Visit: Payer: Self-pay | Admitting: Neurology

## 2020-07-29 ENCOUNTER — Ambulatory Visit: Payer: Medicare PPO | Admitting: Neurology

## 2020-09-20 ENCOUNTER — Encounter: Payer: Self-pay | Admitting: Neurology

## 2020-09-20 ENCOUNTER — Ambulatory Visit: Payer: Medicare PPO | Admitting: Neurology

## 2020-09-20 VITALS — BP 122/86 | HR 77 | Ht 62.0 in | Wt 141.0 lb

## 2020-09-20 DIAGNOSIS — G479 Sleep disorder, unspecified: Secondary | ICD-10-CM | POA: Diagnosis not present

## 2020-09-20 DIAGNOSIS — F419 Anxiety disorder, unspecified: Secondary | ICD-10-CM | POA: Diagnosis not present

## 2020-09-20 DIAGNOSIS — G20 Parkinson's disease: Secondary | ICD-10-CM | POA: Diagnosis not present

## 2020-09-20 DIAGNOSIS — G4752 REM sleep behavior disorder: Secondary | ICD-10-CM

## 2020-09-20 DIAGNOSIS — F32A Depression, unspecified: Secondary | ICD-10-CM

## 2020-09-20 MED ORDER — CARBIDOPA-LEVODOPA ER 50-200 MG PO TBCR: 1.0000 | EXTENDED_RELEASE_TABLET | Freq: Every day | ORAL | 3 refills | Status: DC

## 2020-09-20 MED ORDER — BUSPIRONE HCL 5 MG PO TABS: ORAL_TABLET | ORAL | 5 refills | Status: DC

## 2020-09-20 MED ORDER — CARBIDOPA-LEVODOPA 25-100 MG PO TABS: 1.0000 | ORAL_TABLET | Freq: Every day | ORAL | 3 refills | Status: DC

## 2020-09-20 MED ORDER — CLONAZEPAM 1 MG PO TBDP: 1.0000 mg | ORAL_TABLET | Freq: Every day | ORAL | 1 refills | Status: DC

## 2020-09-20 NOTE — Patient Instructions (Signed)
It was good to see you again today.  I am glad to hear that you are feeling better.  Your exam looks good today.  Please continue to be mindful about constipation issues and try to hydrate well with water, 6 to 8 cups/day are recommended, generally speaking, 8 ounce size each.  You can increase your melatonin, you can take 10 mg at bedtime for sleep, if needed or stay with the 5 mg.  You can increase your BuSpar to 10 mg for your third dose and continue with 5 mg in the morning and midday. We will continue with your current medication regimen otherwise.

## 2020-09-20 NOTE — Progress Notes (Signed)
Subjective:    Patient ID: Paula Fuller is a 70 y.o. female.  HPI     Interim history:  [name] is a 70 year old right-handed woman with an underlying medical history of anxiety, depression, irritable bowel syndrome, recent loss of weight, prior history of hyperlipidemia and reflux disease, who presents for follow-up consultation of her R sided parkinsonism. The patient is unaccompanied today. I last saw her on 05/07/20, at which time she reported that she had worsening dream and acting behavior.  Of note, she tapered herself off her medications but had restarted the clonazepam and the Sinemet CR.  She was advised to be consistent with her medication, she was advised to start BuSpar, she was off of Prozac and she was advised to be consistent with clonazepam.  We increased her Sinemet IR to 1 pill 5 times a day at the time.  Today, 09/20/20: She reports feeling significantly better.  She has been consistent with her medications and the schedule helped.  She has been taking BuSpar 5 mg 3 times daily consistently.  Sometimes she has residual anxiety prior to bedtime.  She takes clonazepam 1 mg strength at night and melatonin 5 mg at night.  She feels that the Sinemet increased to 1 pill 5 times a day has been helpful.  She continues to take the CR at bedtime.  She is overall doing quite well, no significant issues with constipation.  Sometimes she wakes up with stiffness in her neck and shoulder girdle.  This is not a daily symptom.  She tries to exercise in the form of walking.  She drinks caffeine in the form of soda, typically limits herself to 1 serving per day and tries to hydrate well with water.  She is off Prozac and did not do well on it.  She reports weight gain of about 5 pounds since her last visit.  The patient's allergies, current medications, family history, past medical history, past social history, past surgical history and problem list were reviewed and updated as appropriate.     Previously:   I saw her on 11/19/19, At which time she reported that her mood was better.  Prozac was helping, she was on 10 mg strength.  BuSpar was helping for situational anxiety and she was taking it infrequently.  She was on Sinemet 4 times a day.  She reported that she was having a harder time getting through the night with her motor symptoms flaring up in the middle of the night.  She was advised to continue with all her medications but add Sinemet CR at bedtime.  We talked about adding Comtan next.  We also talked about the possibility of just moving her Sinemet IR closer together to add a 5th dose to her regimen.  She emailed recently in September 2021 reporting that she had stopped most of her medications for concern for side effects. She reported having more dream enactment behavior as witnessed by her husband.  She thought that these became worse in the previous 3 months and therefore tapered herself off of most of her meds. She had stopped the Sinemet CR, Prozac, clonazepam.  She was advised to start back on the Sinemet CR and clonazepam and continue with Sinemet IR.       I saw her on 08/21/2019, at which time she reported significant increase in her stress.  She was unable to tolerate Celexa in the recent past and had also tried Lexapro.  In the distant past she had tried  Prozac and we mutually agreed to try low-dose Prozac and for anxiety I suggested BuSpar and low-dose with gradual increase, as needed.  She was advised to continue with her Sinemet.  She declined a referral to psychiatry.  She also declined a referral for a second opinion for her Parkinson's disease.        I saw her on 05/21/2019, at which time she felt improvement with the Sinemet.  She was tolerating it okay.  She had improvement in her headaches after she stopped the Neupro patch.  She had significant issues with anxiety and also depression.  She had weight gain in the past with sertraline.  I suggested a trial of Celexa.   I also suggested she increase the Sinemet to 4 times a day.     She emailed in the interim in early December 2020 indicating that the Sinemet 4 times a day felt too much, she felt dizzy.  She was advised to reduce it back to 3 times daily.  She also reported side effects from the Celexa including nausea and vomiting and appetite loss and was therefore advised to stop it.         I saw her on 02/17/2019, at which time she reported no significant results from the Neupro, had ongoing issues with weight gain and swelling and also felt that she had developed some headaches after starting the Neupro patch.  She reported dream enactment behaviors.  I suggested she stop the Neupro and start a trial of Sinemet.  In addition, she was advised to start clonazepam at night.  She called in the interim reporting that her PCP did not want her on both Xanax and clonazepam and we have discussed this as well.  She was advised to discuss this matter with her primary care physician and see if she could go without the Xanax during the day and continue with the clonazepam at night.    I saw her on 11/21/2018 in a virtual visit, at which time she reported side effects with Mirapex including weight gain and not sleeping well at night.  I suggested we switch her over to the Neupro patch, 2 mg strength.   I saw her on 06/26/2018, At which time she reported feeling better, she reported that the Mirapex was helpful.  She had stopped a new antidepressant and started generic Celexa.  Her anxiety was better.  She was able to tolerate the Mirapex.   I saw her on 03/27/2018, at which time she reported still struggling with her anxiety and depressive symptoms. We talked about her DaT scan results. She had tried multiple different antidepressants in the past, she was given most recently a sample for Trintellix by her PCP but had not started it. She was advised to start it.   She was also advised to start Mirapex with gradual increase. She  called in the interim in November 2019 reporting that Mirapex was not making a big difference. She was advised that she was on a low-dose of advised to increase it. She had also called in September 2019 reporting that she was not able to tolerate the antidepressant that was given to her by her PCP. She was advised to talk to her family doctor about alternative treatment options for anxiety and depression. She had reduced her Xanax.     I first met her on 12/13/2017 at the request of her primary care provider, at which time she reported a bilateral hand tremor of at least 2 years  duration as well as fine motor dyscontrol. Right-sided symptoms were worse on the left. Her examination was concerning for parkinsonism with right-sided lateralization. I suggested we proceed with a brain MRI and we subsequently also proceeded with a DaT scan.  She had a brain MRI with and without contrast on 12/27/2017 and I reviewed the results: IMPRESSION: This MRI of the brain with and without contrast shows the following: 1.    There are some scattered T2/FLAIR hyperintense foci consistent with mild age-related chronic microvascular ischemic changes 2.    There are no acute findings and there is a normal enhancement pattern.   We called her with her test results.  She had a nuclear medicine DaT scan on 03/21/2018 and a very tight results: IMPRESSION: Near absent radiotracer activity within the bilateral posterior striata (putamen) in a pattern typical of a Parkinson's syndrome. pathology. There is mild decreased relative activity in the head of the LEFT caudate nucleus compared to the RIGHT.   We called her with her test results and scheduled her for a sooner than scheduled appointment.   12/13/2017: (She) reports a right more than left hand tremor for the past 2 years, worse in the past few months. She has noticed other issues including fine motor dyscontrol, balance issues, mild short-term memory loss. She has a  history of loss of smell for the past 10-12 years. She has never considered seeing ENT for this. She has suboptimally controlled anxiety. She has been on Lexapro in the past which caused significant GI related side effects and for the past 2 months she has been on sertraline. She does not feel that the sertraline has been helpful. She has been on Xanax off and on since the late 90s when she had to take care of her father. She has been on Xanax consistently 3 times a day for the past couple of years, since 2016 or so. She is currently on sertraline 100 mg daily and alprazolam 0.5 mg 3 times a day. She has had significant weight loss and had multiple tests for this including endoscopy and CAT scan all per her report without significant abnormality is thankfully. She is using some protein milkshakes to supplement her diet. Her tremor is at rest as well as with activity at times. It is more noticeable on the right, she has also noticed difficulty with fine motor tasks also more on the right. She fell recently once in the yard, as she was standing up from the squatting position and thankfully did not hurt herself.I reviewed your office note from 10/29/2017. She has no family history of tremors but reports a family history of Parkinson's disease, her father's first cousin apparently had Parkinson's disease, another first cousin of her father had some other neurological disease, maybe MS. She has worried about MS. She saw a neurologist in [place], [place] in August 2018 told her she does not have MS. She is a nonsmoker and does not drink alcohol and drinks caffeine one serving per day on average. She is married and lives with her husband, they have 1 daughter. 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  Her Past Medical History Is Significant For: Past Medical History:  Diagnosis Date  . Anxiety disorder   . GERD (gastroesophageal reflux disease)   . High  cholesterol   . IBS (irritable bowel syndrome)   . Insomnia     Her Past Surgical History Is Significant For:  Past Surgical History:  Procedure Laterality Date  . CHOLECYSTECTOMY      Her Family History Is Significant For: No family history on file.  Her Social History Is Significant For: Social History   Socioeconomic History  . Marital status: Married    Spouse name: Not on file  . Number of children: Not on file  . Years of education: Not on file  . Highest education level: Not on file  Occupational History  . Not on file  Tobacco Use  . Smoking status: Never Smoker  . Smokeless tobacco: Never Used  Substance and Sexual Activity  . Alcohol use: Never  . Drug use: Never  . Sexual activity: Not on file  Other Topics Concern  . Not on file  Social History Narrative  . Not on file   Social Determinants of Health   Financial Resource Strain: Not on file  Food Insecurity: Not on file  Transportation Needs: Not on file  Physical Activity: Not on file  Stress: Not on file  Social Connections: Not on file    Her Allergies Are:  Allergies  Allergen Reactions  . Prozac [Fluoxetine] Other (See Comments)    Upset Stomach  :   Her Current Medications Are:  Outpatient Encounter Medications as of 09/20/2020  Medication Sig  . busPIRone (BUSPAR) 5 MG tablet Take 1 tablet (5 mg total) by mouth 3 (three) times daily as needed.  . carbidopa-levodopa (SINEMET CR) 50-200 MG tablet Take 1 tablet by mouth at bedtime. Take at 9 PM (1-2 h before bedtime)  . carbidopa-levodopa (SINEMET IR) 25-100 MG tablet Take 1 tablet by mouth 5 (five) times daily. Take at 7, 10, 1 PM, 4 PM, and 7 PM.  . clonazePAM (KLONOPIN) 1 MG disintegrating tablet Take 1 tablet (1 mg total) by mouth at bedtime.  Marland Kitchen MELATONIN PO Take by mouth.   No facility-administered encounter medications on file as of 09/20/2020.  :  Review of Systems:  Out of a complete 14 point review of systems, all are reviewed and  negative with the exception of these symptoms as listed below:  Review of Systems  Neurological:       Here for 3 month f/u. Reports she is doing much better since her last visit.     Objective:  Neurological Exam  Physical Exam Physical Examination:   Vitals:   09/20/20 0838  BP: 122/86  Pulse: 77  SpO2: 98%    General Examination: The patient is a very pleasant 70 y.o. female in no acute distress. She appears well-developed and well-nourished and well groomed.   HEENT:Normocephalic, atraumatic, pupils are equal, round and reactive to light, has corrective eyeglasses, extraocular tracking is fairly well-preserved, moderatefacial masking is noted. No significant lip or jaw tremor is noted today. Airway examination is stable. She has minimal hypophonia, no dysarthria, mild nuchal rigidity, which is stable.  Chest:Clear to auscultation without wheezing, rhonchi or crackles noted.  Heart:S1+S2+0, regular and normal without murmurs, rubs or gallops noted.   Abdomen:Soft, non-tender and non-distended.  Extremities:There is no pitting edema in the distal lower extremities bilaterally.   Skin: Warm and dry without trophic changes noted.   Musculoskeletal: exam reveals no obvious joint deformities, tenderness or joint swelling or erythema.   Neurologically:  Mental status: The patient is awake, alert and oriented in all 4 spheres. Her immediate and remote memory, attention, language skills and fund of knowledge are appropriate. There is no evidence of aphasia, agnosia, apraxia or anomiawith some slowness  in thinking at times,Speech is clear with normal prosody and enunciation. Thought process is linear. Mood is normal and affect isgood.  Cranial nerves II - XII are as described above under HEENT exam. In addition: shoulder shrug is normal, leftshoulder slightly higher. Motor exam: Normal bulk, and strength are noted. She has mild increase in tone in the right upper  extremity.There is a mild intermittent resting tremor in the right hand. Overall mild bradykinesia.  (On 12/13/2017: On Archimedes spiral drawing she has slight insecurity with both hands, no obvious tremor though. Handwriting is legible, not particularly tremulous, but mildly micrographic.)  She has a slight postural tremor in both upper extremities, she has no significant action tremor, no intention tremor. On fine motor testing,she has mild to moderate difficulty on the right and better, near-normal on the left. Cerebellar testing: No dysmetria or intention tremor.There is no truncal or gait ataxia. Sensory exam: intact to light touch.  Gait, station and balance: She stands without difficulty, posture is age-appropriate, slight tilt to the R, left shoulder perhaps slightly higher than right. She walks with a slightly decreased stride length and good pace, decreased arm swing on the right more than left.  She turns quite well, balance is preserved.   Assessmentand Plan:  In summary, Linsey Arteaga is a very pleasant 70 year old female ith an underlying medical history of anxiety, depression, irritable bowel syndrome,weight loss, prior history of hyperlipidemia and reflux disease, who presents forFUconsultation of herright-sided parkinsonism with symptoms dating back to about 3+ years ago, started with a hand tremor.   Her history, examination and DaT scan in 09/19 support the diagnosis of right-sided predominantParkinson's disease. Her brain MRI showed age-appropriate findings in June 2019.Shestarted treatment with Mirapex low-dose and initially did quite well, noticed an improvement in her tremor and her exam had improved. However, shewashaving more trouble with taking 0.25 mg strength 2 pills 3 times daily. She had also gained weight. She was advised to discontinue Mirapex and start Neupro patch in May 2020. Shedid not havetelltale results from the Neupro, had ongoing issues with  swelling and weight gain, has also noticed some headaches after starting the Neupro.We stopped the Neupro patch and she started Sinemet in August 2020. She has been taking 1 pill 4 times a day, we increased this to 1 pill 5 times a day in October 2021. Shehashad some dizziness from it. She has been on clonazepam 0.5 mg each night since October 2020. She had been on Xanax before but no longer takes the Xanax. She has struggled with anxiety and depression for years. She has been on several different medications, could not tolerate Celexa which wetried, and has been on low doseProzacsince February 2021, but tapered off of it due to GI side effects.  She also started having increased dream enactment behavior.  She increase the BuSpar to 5 mg 3 times daily in October 2021.  She has had some residual evening anxiety, particularly prior to bedtime.  She is encouraged to increase her evening dose to 10 mg.  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  We increased her clonazepam to 1 mg strength in October 2021. She is advised to continue with the CR at bedtime, we started this in May 2021.  We talked about the importance of fall prevention and constipation control. She is advised to follow-up in 6 months, sooner if needed.  I answered all her questions today and she was in agreement with the plan. I  spent 30 minutes in total face-to-face time and in reviewing records during pre-charting, more than 50% of which was spent in counseling and coordination of care, reviewing test results, reviewing medications and treatment regimen and/or in discussing or reviewing the diagnosis of PD, the prognosis and treatment options. Pertinent laboratory and imaging test results that were available during this visit with the patient were reviewed by me and considered in my medical decision making (see chart for details).

## 2021-03-23 ENCOUNTER — Encounter: Payer: Self-pay | Admitting: Neurology

## 2021-03-23 ENCOUNTER — Ambulatory Visit: Payer: Medicare PPO | Admitting: Neurology

## 2021-03-23 VITALS — BP 128/79 | HR 75 | Ht 62.0 in | Wt 145.0 lb

## 2021-03-23 DIAGNOSIS — G20 Parkinson's disease: Secondary | ICD-10-CM | POA: Diagnosis not present

## 2021-03-23 DIAGNOSIS — M6289 Other specified disorders of muscle: Secondary | ICD-10-CM | POA: Diagnosis not present

## 2021-03-23 DIAGNOSIS — G4752 REM sleep behavior disorder: Secondary | ICD-10-CM

## 2021-03-23 DIAGNOSIS — F419 Anxiety disorder, unspecified: Secondary | ICD-10-CM

## 2021-03-23 DIAGNOSIS — F32A Depression, unspecified: Secondary | ICD-10-CM

## 2021-03-23 MED ORDER — CLONAZEPAM 1 MG PO TBDP: 1.0000 mg | ORAL_TABLET | Freq: Every day | ORAL | 1 refills | Status: DC

## 2021-03-23 MED ORDER — CYCLOBENZAPRINE HCL 10 MG PO TABS: 10.0000 mg | ORAL_TABLET | Freq: Every evening | ORAL | 3 refills | Status: DC | PRN

## 2021-03-23 MED ORDER — FLUOXETINE HCL 20 MG PO CAPS: 20.0000 mg | ORAL_CAPSULE | Freq: Every day | ORAL | 5 refills | Status: DC

## 2021-03-23 MED ORDER — BUSPIRONE HCL 10 MG PO TABS: 10.0000 mg | ORAL_TABLET | Freq: Three times a day (TID) | ORAL | 5 refills | Status: DC

## 2021-03-23 NOTE — Progress Notes (Signed)
Subjective:    Patient ID: Paula Fuller is a 70 y.o. female.  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 The patient is unaccompanied today. I last saw her on 09/20/20, at which time she reported feeling better.  She had been more consistent with her medications.  She did have residual anxiety especially prior to bedtime.  She was on BuSpar 5 mg 3 times daily consistently.  She was on clonazepam at night and melatonin 5 mg at night.  She was encouraged to try to increase the melatonin to up to 10 mg at night.  She was also advised to increase her evening BuSpar dose to 10 mg and keep the morning and midday doses at 5 mg.  She was advised to continue with the Sinemet CR at night and Sinemet regular release 1 pill 5 times a day.  She found that the increase to the 5 times a day schedule for her IR Sinemet was helpful.  Today, 03/23/21: She reports doing fairly well motor wise.  The medication helps.  She can tell when she is due for her next dose for the immediate release levodopa.  Mood wise, she is not doing well, she has had increase in anxiety.  It really helped to increase the BuSpar at night but she is wondering if she can increase it during the day as well.  She would like to go back on Prozac.  She had GI related side effects especially stomach pain with it but felt that maybe the pain was due to other reasons at the time and would like to get back on the 20 mg of Prozac.  She has not had any falls.  She has intermittent constipation but often struggles with diarrhea because of IBS flareup.  She tries to stay active but could do better on a regular basis, she tries to walk and use a stationary bike.  Her husband has noted that she is been more  forgetful.  He feels like he has to repeat himself.  She has had some trouble remembering her medications as to whether she took it or not.  She is now keeping a written log in a notebook whenever she takes a dose.  She is using melatonin 10 mg as needed at night.  She has felt more stiff in her neck and shoulder girdle first thing in the morning and, although she knows that she should not try her husband's medication, she did try his muscle relaxer about 3 times and felt that she was less stiff first thing in the morning.  She recalls that it was cyclobenzaprine 10 mg strength.  The patient's allergies, current medications, family history, past medical history, past social history, past surgical history and problem list were reviewed and updated as appropriate.    Previously:   I saw her on 05/07/20, at which time she reported that she had worsening dream and acting behavior.  Of note, she tapered herself off her medications but had restarted the clonazepam and the Sinemet CR.  She was advised to be consistent with her medication, she was advised to start BuSpar, she was off of Prozac and she was advised to be consistent with clonazepam.  We increased her Sinemet IR to 1 pill 5 times a day  at the time.       I saw her on 11/19/19, At which time she reported that her mood was better.  Prozac was helping, she was on 10 mg strength.  BuSpar was helping for situational anxiety and she was taking it infrequently.  She was on Sinemet 4 times a day.  She reported that she was having a harder time getting through the night with her motor symptoms flaring up in the middle of the night.  She was advised to continue with all her medications but add Sinemet CR at bedtime.  We talked about adding Comtan next.  We also talked about the possibility of just moving her Sinemet IR closer together to add a 5th dose to her regimen.  She emailed recently in September 2021 reporting that she had stopped most of her medications  for concern for side effects. She reported having more dream enactment behavior as witnessed by her husband.  She thought that these became worse in the previous 3 months and therefore tapered herself off of most of her meds. She had stopped the Sinemet CR, Prozac, clonazepam.  She was advised to start back on the Sinemet CR and clonazepam and continue with Sinemet IR.       I saw her on 08/21/2019, at which time she reported significant increase in her stress.  She was unable to tolerate Celexa in the recent past and had also tried Lexapro.  In the distant past she had tried Prozac and we mutually agreed to try low-dose Prozac and for anxiety I suggested BuSpar and low-dose with gradual increase, as needed.  She was advised to continue with her Sinemet.  She declined a referral to psychiatry.  She also declined a referral for a second opinion for her Parkinson's disease.        I saw her on 05/21/2019, at which time she felt improvement with the Sinemet.  She was tolerating it okay.  She had improvement in her headaches after she stopped the Neupro patch.  She had significant issues with anxiety and also depression.  She had weight gain in the past with sertraline.  I suggested a trial of Celexa.  I also suggested she increase the Sinemet to 4 times a day.     She emailed in the interim in early December 2020 indicating that the Sinemet 4 times a day felt too much, she felt dizzy.  She was advised to reduce it back to 3 times daily.  She also reported side effects from the Celexa including nausea and vomiting and appetite loss and was therefore advised to stop it.         I saw her on 02/17/2019, at which time she reported no significant results from the Neupro, had ongoing issues with weight gain and swelling and also felt that she had developed some headaches after starting the Neupro patch.  She reported dream enactment behaviors.  I suggested she stop the Neupro and start a trial of Sinemet.  In  addition, she was advised to start clonazepam at night.  She called in the interim reporting that her PCP did not want her on both Xanax and clonazepam and we have discussed this as well.  She was advised to discuss this matter with her primary care physician and see if she could go without the Xanax during the day and continue with the clonazepam at night.    I saw her on 11/21/2018 in a virtual visit, at which time she  reported side effects with Mirapex including weight gain and not sleeping well at night.  I suggested we switch her over to the Neupro patch, 2 mg strength.   I saw her on 06/26/2018, At which time she reported feeling better, she reported that the Mirapex was helpful.  She had stopped a new antidepressant and started generic Celexa.  Her anxiety was better.  She was able to tolerate the Mirapex.   I saw her on 03/27/2018, at which time she reported still struggling with her anxiety and depressive symptoms. We talked about her DaT scan results. She had tried multiple different antidepressants in the past, she was given most recently a sample for Trintellix by her PCP but had not started it. She was advised to start it.   She was also advised to start Mirapex with gradual increase. She called in the interim in November 2019 reporting that Mirapex was not making a big difference. She was advised that she was on a low-dose of advised to increase it. She had also called in September 2019 reporting that she was not able to tolerate the antidepressant that was given to her by her PCP. She was advised to talk to her family doctor about alternative treatment options for anxiety and depression. She had reduced her Xanax.     I first met her on 12/13/2017 at the request of her primary care provider, at which time she reported a bilateral hand tremor of at least 2 years duration as well as fine motor dyscontrol. Right-sided symptoms were worse on the left. Her examination was concerning for  parkinsonism with right-sided lateralization. I suggested we proceed with a brain MRI and we subsequently also proceeded with a DaT scan.  She had a brain MRI with and without contrast on 12/27/2017 and I reviewed the results: IMPRESSION: This MRI of the brain with and without contrast shows the following: 1.    There are some scattered T2/FLAIR hyperintense foci consistent with mild age-related chronic microvascular ischemic changes 2.    There are no acute findings and there is a normal enhancement pattern.   We called her with her test results.  She had a nuclear medicine DaT scan on 03/21/2018 and a very tight results: IMPRESSION: Near absent radiotracer activity within the bilateral posterior striata (putamen) in a pattern typical of a Parkinson's syndrome. pathology. There is mild decreased relative activity in the head of the LEFT caudate nucleus compared to the RIGHT.   We called her with her test results and scheduled her for a sooner than scheduled appointment.   12/13/2017: (She) reports a right more than left hand tremor for the past 2 years, worse in the past few months. She has noticed other issues including fine motor dyscontrol, balance issues, mild short-term memory loss. 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 She has never considered seeing ENT for this. She has suboptimally controlled anxiety. She has been on Lexapro in the past which caused significant GI related side effects and for the past 2 months she has been on sertraline. She does not feel that the sertraline has been helpful. She has been on Xanax off and on since the late 90s when she had to take care of her father. She has been on Xanax consistently 3 times a day for the past couple of years, since 2016 or so. She is currently on sertraline 100 mg daily and alprazolam 0.5 mg 3 times a day. She has had  significant weight loss and had multiple tests for this including endoscopy and CAT scan all per her  report without significant abnormality is thankfully. She is using some protein milkshakes to supplement her diet. Her tremor is at rest as well as with activity at times. It is more noticeable on the right, she has also noticed difficulty with fine motor tasks also more on the right. She fell recently once in the yard, as she was standing up from the squatting position and thankfully did not hurt herself.I reviewed your office note from 10/29/2017. She has no family history of tremors but reports a family history of Parkinson's disease, her father's first cousin apparently had Parkinson's disease, another first cousin of her father had some other neurological disease, maybe MS. She has worried about MS. She saw a neurologist in [place], [place] in August 2018 told her she does not have MS. She is a nonsmoker and does not drink alcohol and drinks caffeine one serving per day on average. She is married and lives with her husband, they have 1 daughter. 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  Her Past Medical History Is Significant For: Past Medical History:  Diagnosis Date   Anxiety disorder    GERD (gastroesophageal reflux disease)    High cholesterol    IBS (irritable bowel syndrome)    Insomnia     Her Past Surgical History Is Significant For: Past Surgical History:  Procedure Laterality Date   CHOLECYSTECTOMY      Her Family History Is Significant For: History reviewed. No pertinent family history.  Her Social History Is Significant For: Social History   Socioeconomic History   Marital status: Married    Spouse name: Not on file   Number of children: Not on file   Years of education: Not on file   Highest education level: Not on file  Occupational History   Not on file  Tobacco Use   Smoking status: Never   Smokeless tobacco: Never  Substance and Sexual Activity   Alcohol use: Never   Drug use: Never   Sexual  activity: Not on file  Other Topics Concern   Not on file  Social History Narrative   Not on file   Social Determinants of Health   Financial Resource Strain: Not on file  Food Insecurity: Not on file  Transportation Needs: Not on file  Physical Activity: Not on file  Stress: Not on file  Social Connections: Not on file    Her Allergies Are:  Allergies  Allergen Reactions   Prozac [Fluoxetine] Other (See Comments)    Upset Stomach  :   Her Current Medications Are:  Outpatient Encounter Medications as of 03/23/2021  Medication Sig   busPIRone (BUSPAR) 5 MG tablet Take 1 pill in the morning, 1 at midday and 2 pills at night, as needed.   carbidopa-levodopa (SINEMET CR) 50-200 MG tablet Take 1 tablet by mouth at bedtime. Take at 9 PM (1-2 h before bedtime)   carbidopa-levodopa (SINEMET IR) 25-100 MG tablet Take 1 tablet by mouth 5 (five) times daily. Take at 7, 10, 1 PM, 4 PM, and 7 PM.   clonazePAM (KLONOPIN) 1 MG disintegrating tablet Take 1 tablet (1 mg total) by mouth at bedtime.   MELATONIN PO Take by mouth.   No facility-administered encounter medications on file as of 03/23/2021.  :  Review of Systems:  Out of a complete 14 point  review of systems, all are reviewed and negative with the exception of these symptoms as listed below:  Review of Systems  Neurological:        Parkinson's f/u. STABLE.   Objective:  Neurological Exam  Physical Exam Physical Examination:   Vitals:   03/23/21 0915  BP: 128/79  Pulse: 75    General Examination: The patient is a very pleasant 70 y.o. female in no acute distress. She appears well-developed and well-nourished and well groomed.   HEENT: Normocephalic, atraumatic, pupils are equal, round and reactive to light, has corrective eyeglasses, extraocular tracking is fairly well-preserved, moderate facial masking is noted. No significant lip or jaw tremor is noted today. Airway examination is stable. She has minimal hypophonia, no  dysarthria, mild nuchal rigidity, which is stable.    Chest: Clear to auscultation without wheezing, rhonchi or crackles noted.   Heart: S1+S2+0, regular and normal without murmurs, rubs or gallops noted.    Abdomen: Soft, non-tender and non-distended.   Extremities: There is no pitting edema in the distal lower extremities bilaterally.    Skin: Warm and dry without trophic changes noted.    Musculoskeletal: exam reveals no obvious joint deformities.    Neurologically:  Mental status: The patient is awake, alert and oriented in all 4 spheres. Her immediate and remote memory, attention, language skills and fund of knowledge are fairly good, no obvious forgetfulness or word finding difficulty, perhaps some slowness in thinking at times. Mood is mildly depressed, she is near tears at one point.      Cranial nerves II - XII are as described above under HEENT exam. In addition: shoulder shrug is normal, left shoulder slightly higher. Motor exam: Normal bulk, and strength are noted. She has mild increase in tone in the right upper extremity. There is a mild to at times moderate resting tremor in the right hand. Overall mild bradykinesia.   (On 12/13/2017: On Archimedes spiral drawing she has slight insecurity with both hands, no obvious tremor though. Handwriting is legible, not particularly tremulous, but mildly micrographic.)    She has a slight postural tremor in both upper extremities, she has no significant action tremor, no intention tremor. On fine motor testing, she has mild to moderate difficulty on the right and better, near-normal on the left.   Cerebellar testing: No dysmetria or intention tremor. There is no truncal or gait ataxia. Sensory exam: intact to light touch.  Gait, station and balance: She stands without difficulty, posture is slightly stooped for age, slight tilt to the R, left shoulder perhaps slightly higher than right. She walks with a slightly decreased stride length and  good pace, decreased arm swing on the right more than left.  She turns quite well, balance is preserved.    Assessment and Plan:  In summary, Laraina Sulton is a very pleasant 70 year old female ith an underlying medical history of anxiety, depression, irritable bowel syndrome, weight loss, prior history of hyperlipidemia and reflux disease, who presents for FU consultation of her right-sided parkinsonism with symptoms dating back to about 3+ years ago, started with a hand tremor.   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 Her brain MRI showed age-appropriate findings in June 2019. She started treatment with Mirapex low-dose and initially did quite well, noticed an improvement in her tremor and her exam had improved.  However, she was having more trouble with taking 0.25 mg strength 2 pills 3 times daily. She  had also gained weight.  She was advised to discontinue Mirapex and start Neupro patch in May 2020.  She did not have telltale results from the Neupro, had ongoing issues with swelling and weight gain, has also noticed some headaches after starting the Neupro. We stopped the Neupro patch and she started Sinemet in August 2020.  She has been taking 1 pill 4 times a day, we increased this to 1 pill 5 times a day in October 2021.  She has had some dizziness from it, but overall tolerates it and has done well with it.  She has been on clonazepam 0.5 mg each night since October 2020.  She had been on Xanax before but no longer takes the Xanax.  She has struggled with anxiety and depression for years.  She has been on several different medications, could not tolerate Celexa which we tried, and has been on low dose Prozac since February 2021, but tapered off of it due to GI side effects.  She also started having increased dream enactment behavior.  She increase the BuSpar to 5 mg 3 times daily in October 2021.  She has had some residual  evening anxiety, particularly prior to bedtime.  She was encouraged to increase the BuSpar to 10 mg at bedtime and our visit in March 2022.  She is struggling with more anxiety and depression, lack of motivation and initiative.  She would like to restart the Prozac at 20 mg strength.  I am happy to restart this, she is going to watch for GI related side effects.  She is also to date, I had to renew her clonazepam prescription.  She had tried her husband's muscle relaxant a couple of times at night and woke up less stiff in her neck and upper shoulder areas.  While she knows that she should not use somebody else's prescription, we can try her on a low-dose Flexeril, she recalled the name and the dose.  She can take 10 mg at bedtime as needed.  She is made aware of the potential side effects including sleepiness, daytime grogginess, feeling off balance.  We increased her clonazepam to 1 mg strength in October 2021.  We started the Sinemet CR at bedtime in May 2021.  We talked about the importance of fall prevention, staying active mentally and physically, maintaining healthy weight and hydrating well with water.  She is advised to stay proactive about constipation issues but she goes back and forth between constipation and diarrhea depending on her irritable bowel syndrome flareup.  She is at this point advised to follow-up routinely in 4 months, sooner if needed.  I answered all her questions today and she was in agreement with the plan.  I spent 40 minutes in total face-to-face time and in reviewing records during pre-charting, more than 50% of which was spent in counseling and coordination of care, reviewing test results, reviewing medications and treatment regimen and/or in discussing or reviewing the diagnosis of PD, the prognosis and treatment options. Pertinent laboratory and imaging test results that were available during this visit with the patient were reviewed by me and considered in my medical decision  making (see chart for details).

## 2021-03-23 NOTE — Patient Instructions (Addendum)
It was nice to see you again today.  I am glad to hear that you are have been stable, motor wise, at least.  As discussed, we will continue to monitor your memory.  For depression, I will restart you on Prozac, we will use the 20 mg strength at your request.  We can increase your BuSpar for anxiety to 10 mg 3 times daily.  For muscle stiffness at night and first thing in the morning, we can try you on a low-dose Flexeril, take 10 mg as needed at bedtime.  Please note that side effects may include dizziness, sleepiness, mouth dryness, feeling off balance, so please be mindful. You may not be able to drive after taking it.   I think we can keep your other medications the same, I renewed your clonazepam, your Sinemet prescriptions were up-to-date.  Please follow-up routinely in 4 months, sooner if needed.

## 2021-08-02 ENCOUNTER — Encounter: Payer: Self-pay | Admitting: Neurology

## 2021-08-02 ENCOUNTER — Ambulatory Visit: Payer: Medicare PPO | Admitting: Neurology

## 2021-08-02 DIAGNOSIS — F419 Anxiety disorder, unspecified: Secondary | ICD-10-CM | POA: Diagnosis not present

## 2021-08-02 DIAGNOSIS — M6289 Other specified disorders of muscle: Secondary | ICD-10-CM

## 2021-08-02 DIAGNOSIS — F32A Depression, unspecified: Secondary | ICD-10-CM

## 2021-08-02 DIAGNOSIS — G4752 REM sleep behavior disorder: Secondary | ICD-10-CM

## 2021-08-02 DIAGNOSIS — G20 Parkinson's disease: Secondary | ICD-10-CM | POA: Diagnosis not present

## 2021-08-02 DIAGNOSIS — G20A1 Parkinson's disease without dyskinesia, without mention of fluctuations: Secondary | ICD-10-CM

## 2021-08-02 MED ORDER — CYCLOBENZAPRINE HCL 10 MG PO TABS: 10.0000 mg | ORAL_TABLET | Freq: Every evening | ORAL | 3 refills | Status: DC | PRN

## 2021-08-02 MED ORDER — FLUOXETINE HCL 20 MG PO CAPS: 20.0000 mg | ORAL_CAPSULE | Freq: Every day | ORAL | 3 refills | Status: DC

## 2021-08-02 MED ORDER — BUSPIRONE HCL 10 MG PO TABS: ORAL_TABLET | ORAL | 5 refills | Status: DC

## 2021-08-02 MED ORDER — CLONAZEPAM 0.25 MG PO TBDP: 0.2500 mg | ORAL_TABLET | Freq: Every evening | ORAL | 5 refills | Status: DC | PRN

## 2021-08-02 MED ORDER — CARBIDOPA-LEVODOPA 25-100 MG PO TABS: 1.0000 | ORAL_TABLET | Freq: Every day | ORAL | 3 refills | Status: DC

## 2021-08-02 MED ORDER — CARBIDOPA-LEVODOPA ER 50-200 MG PO TBCR: 1.0000 | EXTENDED_RELEASE_TABLET | Freq: Every day | ORAL | 3 refills | Status: DC

## 2021-08-02 MED ORDER — CLONAZEPAM 1 MG PO TBDP: 1.0000 mg | ORAL_TABLET | Freq: Every day | ORAL | 1 refills | Status: DC

## 2021-08-02 NOTE — Patient Instructions (Signed)
It was nice to see you again, Paula Fuller! You look well and I am glad to hear, that you feel well.  As discussed, we will stay the course with all your medications with the exception of adding a small dose of clonazepam 0.25 mg in the middle of the night when you wake up.  Try not to take it later than 3 or 3:30 AM.  If you end up waking up at 4 AM, you might want to take just half a pill which is 0.125 mg.  This is in addition to your standard bedtime dose of clonazepam 1 mg.  This would be a second prescription and separate pill bottle. We will continue with Sinemet long-acting 1 pill at bedtime, Sinemet immediate release 1 pill 5 times a day at the current timings, you can continue with Flexeril 10 mg as needed at bedtime or in the evening.  You can continue with fluoxetine 20 mg daily and BuSpar for anxiety 10 mg strength 1 pill in the morning and 2 pills in the afternoon. Please continue to maintain a healthy lifestyle, good nutrition, good hydration with water and regular exercise.  Please follow-up routinely in this clinic in 6 months, sooner if needed.  You can always call us for any interim questions or concerns or email [place] through [place].

## 2021-08-02 NOTE — Progress Notes (Signed)
Subjective:    Patient ID: Paula Fuller is a 71 y.o. female.  HPI    Interim history:   Paula Fuller is a 71 year old right-handed woman with an underlying medical history of anxiety, depression, irritable bowel syndrome, recent loss of weight, prior history of hyperlipidemia and reflux disease, who presents for follow-up consultation of her R sided parkinsonism. The patient is unaccompanied today. I last saw her on 03/23/21, at which time she reported doing fairly well motor wise.  She had residual anxiety.  We mutually agreed to increase her BuSpar.  We also restarted her Prozac.  She was advised to continue with Sinemet and Sinemet CR.  She was using melatonin at night.  She had occasional neck stiffness and shoulder girdle stiffness and had tried Flexeril 10 mg strength from her husband.  I did prescribe Flexeril for her for as needed use.  She was advised not to use her husband's medication though.  She was active physically.  Today, 08/02/2021: She reports doing quite well overall.  Her appetite is good, hydration status typically good, activity level good with walking on a regular basis or using a stationary bike.  She is in good spirits and feels that her mood is better.  She takes Prozac daily and BuSpar 10 mg in the morning and 20 mg in the afternoon.  The only thing that is not as good lately is her sleep.  For the past month and a half she consistently seems to wake up right around 3 AM.  Her husband has noticed intermittent issues with sleep talking and dream activity.  She takes clonazepam 1 mg at bedtime and has wondered if she should increase the clonazepam or add more melatonin.  She takes 10 mg of melatonin nightly.  Overall, compared to a few months ago she feels a lot better.  No recent falls, no constipation issues, no recent stressors.  She had a follow-up with her eye specialist.  She is getting new eyeglasses including prism eye lenses, she has had intermittent double vision.  She does  have bilateral cataracts as well which are being monitored.   The patient's allergies, current medications, family history, past medical history, past social history, past surgical history and problem list were reviewed and updated as appropriate.    Previously:   I saw her on 09/20/20, at which time she reported feeling better.  She had been more consistent with her medications.  She did have residual anxiety especially prior to bedtime.  She was on BuSpar 5 mg 3 times daily consistently.  She was on clonazepam at night and melatonin 5 mg at night.  She was encouraged to try to increase the melatonin to up to 10 mg at night.  She was also advised to increase her evening BuSpar dose to 10 mg and keep the morning and midday doses at 5 mg.  She was advised to continue with the Sinemet CR at night and Sinemet regular release 1 pill 5 times a day.  She found that the increase to the 5 times a day schedule for her IR Sinemet was helpful.    I saw her on 05/07/20, at which time she reported that she had worsening dream and acting behavior.  Of note, she tapered herself off her medications but had restarted the clonazepam and the Sinemet CR.  She was advised to be consistent with her medication, she was advised to start BuSpar, she was off of Prozac and she was advised to be consistent  with clonazepam.  We increased her Sinemet IR to 1 pill 5 times a day at the time.     I saw her on 11/19/19, At which time she reported that her mood was better.  Prozac was helping, she was on 10 mg strength.  BuSpar was helping for situational anxiety and she was taking it infrequently.  She was on Sinemet 4 times a day.  She reported that she was having a harder time getting through the night with her motor symptoms flaring up in the middle of the night.  She was advised to continue with all her medications but add Sinemet CR at bedtime.  We talked about adding Comtan next.  We also talked about the possibility of just moving  her Sinemet IR closer together to add a 5th dose to her regimen.  She emailed recently in September 2021 reporting that she had stopped most of her medications for concern for side effects. She reported having more dream enactment behavior as witnessed by her husband.  She thought that these became worse in the previous 3 months and therefore tapered herself off of most of her meds. She had stopped the Sinemet CR, Prozac, clonazepam.  She was advised to start back on the Sinemet CR and clonazepam and continue with Sinemet IR.       I saw her on 08/21/2019, at which time she reported significant increase in her stress.  She was unable to tolerate Celexa in the recent past and had also tried Lexapro.  In the distant past she had tried Prozac and we mutually agreed to try low-dose Prozac and for anxiety I suggested BuSpar and low-dose with gradual increase, as needed.  She was advised to continue with her Sinemet.  She declined a referral to psychiatry.  She also declined a referral for a second opinion for her Parkinson's disease.        I saw her on 05/21/2019, at which time she felt improvement with the Sinemet.  She was tolerating it okay.  She had improvement in her headaches after she stopped the Neupro patch.  She had significant issues with anxiety and also depression.  She had weight gain in the past with sertraline.  I suggested a trial of Celexa.  I also suggested she increase the Sinemet to 4 times a day.     She emailed in the interim in early December 2020 indicating that the Sinemet 4 times a day felt too much, she felt dizzy.  She was advised to reduce it back to 3 times daily.  She also reported side effects from the Celexa including nausea and vomiting and appetite loss and was therefore advised to stop it.         I saw her on 02/17/2019, at which time she reported no significant results from the Neupro, had ongoing issues with weight gain and swelling and also felt that she had developed  some headaches after starting the Neupro patch.  She reported dream enactment behaviors.  I suggested she stop the Neupro and start a trial of Sinemet.  In addition, she was advised to start clonazepam at night.  She called in the interim reporting that her PCP did not want her on both Xanax and clonazepam and we have discussed this as well.  She was advised to discuss this matter with her primary care physician and see if she could go without the Xanax during the day and continue with the clonazepam at night.  I saw her on 11/21/2018 in a virtual visit, at which time she reported side effects with Mirapex including weight gain and not sleeping well at night.  I suggested we switch her over to the Neupro patch, 2 mg strength.   I saw her on 06/26/2018, At which time she reported feeling better, she reported that the Mirapex was helpful.  She had stopped a new antidepressant and started generic Celexa.  Her anxiety was better.  She was able to tolerate the Mirapex.   I saw her on 03/27/2018, at which time she reported still struggling with her anxiety and depressive symptoms. We talked about her DaT scan results. She had tried multiple different antidepressants in the past, she was given most recently a sample for Trintellix by her PCP but had not started it. She was advised to start it.   She was also advised to start Mirapex with gradual increase. She called in the interim in November 2019 reporting that Mirapex was not making a big difference. She was advised that she was on a low-dose of advised to increase it. She had also called in September 2019 reporting that she was not able to tolerate the antidepressant that was given to her by her PCP. She was advised to talk to her family doctor about alternative treatment options for anxiety and depression. She had reduced her Xanax.     I first met her on 12/13/2017 at the request of her primary care provider, at which time she reported a bilateral hand  tremor of at least 2 years duration as well as fine motor dyscontrol. Right-sided symptoms were worse on the left. Her examination was concerning for parkinsonism with right-sided lateralization. I suggested we proceed with a brain MRI and we subsequently also proceeded with a DaT scan.  She had a brain MRI with and without contrast on 12/27/2017 and I reviewed the results: IMPRESSION: This MRI of the brain with and without contrast shows the following: 1.    There are some scattered T2/FLAIR hyperintense foci consistent with mild age-related chronic microvascular ischemic changes 2.    There are no acute findings and there is a normal enhancement pattern.   We called her with her test results.  She had a nuclear medicine DaT scan on 03/21/2018 and a very tight results: IMPRESSION: Near absent radiotracer activity within the bilateral posterior striata (putamen) in a pattern typical of a Parkinson's syndrome. pathology. There is mild decreased relative activity in the head of the LEFT caudate nucleus compared to the RIGHT.   We called her with her test results and scheduled her for a sooner than scheduled appointment.   12/13/2017: (She) reports a right more than left hand tremor for the past 2 years, worse in the past few months. She has noticed other issues including fine motor dyscontrol, balance issues, mild short-term memory loss. 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 She has never considered seeing ENT for this. She has suboptimally controlled anxiety. She has been on Lexapro in the past which caused significant GI related side effects and for the past 2 months she has been on sertraline. She does not feel that the sertraline has been helpful. She has been on Xanax off and on since the late 90s when she had to take care of her father. She has been on Xanax consistently 3 times a day for the past couple of years, since 2016 or so. She is currently on sertraline 100  mg daily  and alprazolam 0.5 mg 3 times a day. She has had significant weight loss and had multiple tests for this including endoscopy and CAT scan all per her report without significant abnormality is thankfully. She is using some protein milkshakes to supplement her diet. Her tremor is at rest as well as with activity at times. It is more noticeable on the right, she has also noticed difficulty with fine motor tasks also more on the right. She fell recently once in the yard, as she was standing up from the squatting position and thankfully did not hurt herself.I reviewed your office note from 10/29/2017. She has no family history of tremors but reports a family history of Parkinson's disease, her father's first cousin apparently had Parkinson's disease, another first cousin of her father had some other neurological disease, maybe MS. She has worried about MS. She saw a neurologist in [place], [place] in August 2018 told her she does not have MS. She is a nonsmoker and does not drink alcohol and drinks caffeine one serving per day on average. She is married and lives with her husband, they have 1 daughter. 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  Her Past Medical History Is Significant For: Past Medical History:  Diagnosis Date   Anxiety disorder    GERD (gastroesophageal reflux disease)    High cholesterol    IBS (irritable bowel syndrome)    Insomnia     Her Past Surgical History Is Significant For: Past Surgical History:  Procedure Laterality Date   CHOLECYSTECTOMY      Her Family History Is Significant For: Family History  Problem Relation Age of Onset   Parkinson's disease Paternal Uncle    Parkinson's disease Paternal Uncle    Tremor Neg Hx     Her Social History Is Significant For: Social History   Socioeconomic History   Marital status: Married    Spouse name: Not on file   Number of children: Not on file   Years of  education: Not on file   Highest education level: Not on file  Occupational History   Not on file  Tobacco Use   Smoking status: Never   Smokeless tobacco: Never  Vaping Use   Vaping Use: Never used  Substance and Sexual Activity   Alcohol use: Never   Drug use: Never   Sexual activity: Not on file  Other Topics Concern   Not on file  Social History Narrative   Not on file   Social Determinants of Health   Financial Resource Strain: Not on file  Food Insecurity: Not on file  Transportation Needs: Not on file  Physical Activity: Not on file  Stress: Not on file  Social Connections: Not on file    Her Allergies Are:  Allergies  Allergen Reactions   Prozac [Fluoxetine] Other (See Comments)    Upset Stomach  :   Her Current Medications Are:  Outpatient Encounter Medications as of 08/02/2021  Medication Sig   busPIRone (BUSPAR) 10 MG tablet Take 1 tablet (10 mg total) by mouth 3 (three) times daily. Take 1 pill three times a day as needed for anxiety   carbidopa-levodopa (SINEMET CR) 50-200 MG tablet Take 1 tablet by mouth at bedtime. Take at 9 PM (1-2 h before bedtime)   carbidopa-levodopa (SINEMET IR) 25-100 MG tablet Take 1 tablet by mouth 5 (five) times daily. Take at 7, 10, 1 PM, 4  PM, and 7 PM.   clonazePAM (KLONOPIN) 1 MG disintegrating tablet Take 1 tablet (1 mg total) by mouth at bedtime.   cyclobenzaprine (FLEXERIL) 10 MG tablet Take 1 tablet (10 mg total) by mouth at bedtime as needed for muscle spasms.   FLUoxetine (PROZAC) 20 MG capsule Take 1 capsule (20 mg total) by mouth daily.   MELATONIN PO Take by mouth.   No facility-administered encounter medications on file as of 08/02/2021.  :  Review of Systems:  Out of a complete 14 point review of systems, all are reviewed and negative with the exception of these symptoms as listed below:  Review of Systems  Neurological:        Pt is here for parkinson follow up. Pt states she wakes up at 300am every morning  and not able to go back to sleep. She wants to know if you can increase her klonopin or melatonin.    Objective:  Neurological Exam  Physical Exam Physical Examination:   Vitals:   08/02/21 1057  BP: 118/84  Pulse: 77    General Examination: The patient is a very pleasant 71 y.o. female in no acute distress. She appears well-developed and well-nourished and well groomed. Good spirits.   HEENT: Normocephalic, atraumatic, pupils are equal, round and reactive to light, has corrective eyeglasses, bilateral cataracts noted.  Extraocular tracking is fairly well-preserved, moderate facial masking is noted. No significant lip or jaw tremor is noted today. Airway examination is stable. She has minimal hypophonia, no dysarthria, mild nuchal rigidity, which is stable.    Chest: Clear to auscultation without wheezing, rhonchi or crackles noted.   Heart: S1+S2+0, regular and normal without murmurs, rubs or gallops noted.    Abdomen: Soft, non-tender and non-distended.   Extremities: There is no pitting edema in the distal lower extremities bilaterally.    Skin: Warm and dry without trophic changes noted.    Musculoskeletal: exam reveals no obvious joint deformities.    Neurologically:  Mental status: The patient is awake, alert and oriented in all 4 spheres. Her immediate and remote memory, attention, language skills and fund of knowledge are good. Mood and affect normal.    Cranial nerves II - XII are as described above under HEENT exam. In addition: shoulder shrug is normal, left shoulder slightly higher. Motor exam: Normal bulk, and strength are noted. She has mild increase in tone in the right upper extremity. There is a mild intermittent resting tremor in the right hand. Overall mild bradykinesia.   (On 12/13/2017: On Archimedes spiral drawing she has slight insecurity with both hands, no obvious tremor though. Handwriting is legible, not particularly tremulous, but mildly micrographic.)     She has a slight postural tremor in both upper extremities, she has no significant action tremor, no intention tremor. On fine motor testing, she has mild to moderate difficulty on the right, better on the left.   Cerebellar testing: No dysmetria or intention tremor. There is no truncal or gait ataxia. Sensory exam: intact to light touch.  Gait, station and balance: She stands without difficulty, posture is slightly stooped for age, slight tilt to the R, left shoulder perhaps slightly higher than right. She walks with a slightly decreased stride length and good pace, decreased arm swing on the right more than left.  She turns quite well, balance is preserved.    Assessment and Plan:  In summary, Momoka Stringfield is a very pleasant 71 year old female ith an underlying medical history of anxiety, depression,  irritable bowel syndrome, weight loss, prior history of hyperlipidemia and reflux disease, who presents for follow-up consultation of her right-sided predominant Parkinson's disease, symptoms dating back to about 3 1/2 years ago.  Her DaT scan in 09/19 supported the diagnosis. Her brain MRI in 06/19 showed age-appropriate findings. She started treatment with Mirapex low-dose and initially did quite well, noticed an improvement in her tremor and her exam had improved.  However, she was having more trouble with taking 0.25 mg strength 2 pills 3 times daily. She also gained weight while on it.  She was advised to discontinue Mirapex and start Neupro patch in May 2020.  She did not have telltale results from the Neupro, had ongoing issues with swelling and weight gain, also headaches. We stopped the Neupro patch and she started Sinemet in August 2020. We increased this to 1 pill 5 times a day in October 2021.  She has had some dizziness from it, but overall tolerates it and has done well with it.  She has been on clonazepam 0.5 mg each night since October 2020.  She had been on Xanax before but no longer takes  the Xanax.  She has struggled with anxiety and depression for years.  She has been on several different medications, could not tolerate Celexa which we tried, and has been on low dose Prozac since February 2021, but tapered off of it due to GI side effects. She started having increased dream enactment behavior.  She increased the BuSpar to 5 mg 3 times daily in October 2021.  She has had some residual evening anxiety, particularly prior to bedtime.  She was encouraged to increase the BuSpar to 10 mg at bedtime in March 2022. We restarted Prozac at 20 mg strength in 09/22. She started Flexeril 10 mg at bedtime as needed in September 2022.  We increased her clonazepam to 1 mg strength in October 2021.  We started the Sinemet CR at bedtime in May 2021.  She has been doing quite well but sleep maintenance has been difficult.  She has been taking melatonin 10 mg at bedtime.  She has residual sleep talking and dream enactment behavior.  She is advised to continue with her current medication regimen including Sinemet CR at bedtime, Sinemet IR 1 pill 5 times a day, and clonazepam 1 mg at bedtime but add a smaller dose in the middle of the night, 0.25 mg of clonazepam around 3 AM.  If she ends up waking up later such as 4 AM, she is advised to take may be just half of the dose, as in 0.125 mg.  She has been taking BuSpar 10 mg in the morning and 20 mg in the late afternoon.  She can maintain this scheduled.  She has been tolerating the Prozac and mood is better. We talked about the importance of fall prevention, staying active mentally and physically, maintaining healthy weight and hydrating well with water.  She is advised to stay proactive about constipation issues but she does go back and forth between constipation and diarrhea depending on her irritable bowel syndrome flareup.  She is advised to follow-up routinely in 6 months, sooner if needed.  I answered all her questions today and she was in agreement.  I spent  40 minutes in total face-to-face time and in reviewing records during pre-charting, more than 50% of which was spent in counseling and coordination of care, reviewing test results, reviewing medications and treatment regimen and/or in discussing or reviewing the diagnosis of  PD, the prognosis and treatment options. Pertinent laboratory and imaging test results that were available during this visit with the patient were reviewed by me and considered in my medical decision making (see chart for details).

## 2022-01-31 ENCOUNTER — Encounter: Payer: Self-pay | Admitting: Neurology

## 2022-01-31 ENCOUNTER — Ambulatory Visit: Payer: Medicare PPO | Admitting: Neurology

## 2022-01-31 VITALS — BP 111/74 | HR 71 | Ht 62.0 in | Wt 143.0 lb

## 2022-01-31 DIAGNOSIS — G20 Parkinson's disease: Secondary | ICD-10-CM | POA: Diagnosis not present

## 2022-01-31 DIAGNOSIS — G4752 REM sleep behavior disorder: Secondary | ICD-10-CM | POA: Diagnosis not present

## 2022-01-31 DIAGNOSIS — F419 Anxiety disorder, unspecified: Secondary | ICD-10-CM

## 2022-01-31 DIAGNOSIS — F32A Depression, unspecified: Secondary | ICD-10-CM | POA: Diagnosis not present

## 2022-01-31 MED ORDER — CLONAZEPAM 1 MG PO TBDP: 2.0000 mg | ORAL_TABLET | Freq: Every day | ORAL | 1 refills | Status: DC

## 2022-01-31 NOTE — Patient Instructions (Addendum)
It was nice to see you again, and to catch up with you.  You are looking well.  I am glad that you are sleeping better, with the increased dose in clonazepam which is okay to continue like this.  I have printed a new prescription for clonazepam and everything else will stay the same.  You can use BuSpar as needed.  You are up-to-date with your prescriptions for Prozac, Sinemet immediate release and Sinemet long-acting.  Please follow-up routinely in 6 months, sooner if needed, stay in touch via MyChart if possible or you can always call.

## 2022-01-31 NOTE — Progress Notes (Signed)
Subjective:    Patient ID: Paula Fuller is a 71 y.o. female.  HPI    Interim history:   Paula Fuller is a 71 year old right-handed woman with an underlying medical history of anxiety, depression, irritable bowel syndrome, recent loss of weight, prior history of hyperlipidemia and reflux disease, who presents for follow-up consultation of her R sided parkinsonism. The patient is unaccompanied today. I last saw her on 08/02/2021, at which time she reported doing well overall but still had some difficulty maintaining sleep and intermittent issues with sleep talking and dream enactment behavior per husband's feedback.  Mood wise she felt a lot better compared to few months prior.  I suggested adding a small dose of clonazepam 0.25 mg in the middle of the night, preferably no later than 3:30 AM.  She was also advised to add half of a dose if she were to wake up more closer to 4 AM.  She was advised to continue with her clonazepam 1 mg at bedtime, Sinemet CR at bedtime, and Sinemet IR 1 pill 5 times a day.  She was advised to continue with Flexeril 10 mg at bedtime as needed, continue with generic Prozac 20 mg daily and BuSpar 10 mg in the morning and 20 mg in the afternoon.  Today, 01/31/2022: She reports doing well, she is sleeping better.  About a month ago, she started taking 2 of the clonazepam at bedtime and this has helped a lot more.  She was still having vivid dreams and dream enactment behavior on 1 mg of clonazepam at bedtime.  She never actually tried the 0.25 mg strength.  She stopped taking clonazepam altogether for a few weeks which made her sleep worse but when she doubled up on it she noticed a big difference and her husband also reported to her that she had very little movements and still has some sleep talking or mumbling unintelligibly typically in the early morning hours.  She has done well without BuSpar, has not needed it.  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  She had done this between the month of January and May 2023 and is ready to start back this fall to volunteer.  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  She is good mood wise, continues to tolerate Prozac low-dose, she continues to take Sinemet CR at bedtime and IR 5 times a day at scheduled 3 hourly intervals.  She has not had any falls, she is very active when she volunteers in class but does have some challenges navigating stairs.  This is more so because of her vision and not gauging the depth of the stairs as well.  She does use prism eyeglasses.  No significant issues with constipation or diarrhea, nothing out of her usual.  She continues to take probiotic daily.  The patient's allergies, current medications, family history, past medical history, past social history, past surgical history and problem list were reviewed and updated as appropriate.    Previously:  I saw her on 03/23/21, at which time she reported doing fairly well motor wise.  She had residual anxiety.  We mutually agreed to increase her BuSpar.  We also restarted her Prozac.  She was advised to continue with Sinemet and Sinemet CR.  She was using melatonin at night.  She had occasional neck  stiffness and shoulder girdle stiffness and had tried Flexeril 10 mg strength from her husband.  I did prescribe Flexeril for her for as needed use.  She was advised not to use her husband's medication though.  She was active physically.     I saw her on 09/20/20, at which time she reported feeling better.  She had been more consistent with her medications.  She did have residual anxiety especially prior to bedtime.  She was on BuSpar 5 mg 3 times daily consistently.  She was on clonazepam at night and melatonin 5 mg at night.  She was encouraged to try to increase the melatonin to up to 10 mg at  night.  She was also advised to increase her evening BuSpar dose to 10 mg and keep the morning and midday doses at 5 mg.  She was advised to continue with the Sinemet CR at night and Sinemet regular release 1 pill 5 times a day.  She found that the increase to the 5 times a day schedule for her IR Sinemet was helpful.     I saw her on 05/07/20, at which time she reported that she had worsening dream and acting behavior.  Of note, she tapered herself off her medications but had restarted the clonazepam and the Sinemet CR.  She was advised to be consistent with her medication, she was advised to start BuSpar, she was off of Prozac and she was advised to be consistent with clonazepam.  We increased her Sinemet IR to 1 pill 5 times a day at the time.     I saw her on 11/19/19, At which time she reported that her mood was better.  Prozac was helping, she was on 10 mg strength.  BuSpar was helping for situational anxiety and she was taking it infrequently.  She was on Sinemet 4 times a day.  She reported that she was having a harder time getting through the night with her motor symptoms flaring up in the middle of the night.  She was advised to continue with all her medications but add Sinemet CR at bedtime.  We talked about adding Comtan next.  We also talked about the possibility of just moving her Sinemet IR closer together to add a 5th dose to her regimen.  She emailed recently in September 2021 reporting that she had stopped most of her medications for concern for side effects. She reported having more dream enactment behavior as witnessed by her husband.  She thought that these became worse in the previous 3 months and therefore tapered herself off of most of her meds. She had stopped the Sinemet CR, Prozac, clonazepam.  She was advised to start back on the Sinemet CR and clonazepam and continue with Sinemet IR.       I saw her on 08/21/2019, at which time she reported significant increase in her stress.  She  was unable to tolerate Celexa in the recent past and had also tried Lexapro.  In the distant past she had tried Prozac and we mutually agreed to try low-dose Prozac and for anxiety I suggested BuSpar and low-dose with gradual increase, as needed.  She was advised to continue with her Sinemet.  She declined a referral to psychiatry.  She also declined a referral for a second opinion for her Parkinson's disease.        I saw her on 05/21/2019, at which time she felt improvement with the Sinemet.  She was tolerating it okay.  She had improvement in her headaches after she stopped the Neupro patch.  She had significant issues with anxiety and also depression.  She had weight gain in the past with sertraline.  I suggested a trial of Celexa.  I also suggested she increase the Sinemet to 4 times a day.     She emailed in the interim in early December 2020 indicating that the Sinemet 4 times a day felt too much, she felt dizzy.  She was advised to reduce it back to 3 times daily.  She also reported side effects from the Celexa including nausea and vomiting and appetite loss and was therefore advised to stop it.         I saw her on 02/17/2019, at which time she reported no significant results from the Neupro, had ongoing issues with weight gain and swelling and also felt that she had developed some headaches after starting the Neupro patch.  She reported dream enactment behaviors.  I suggested she stop the Neupro and start a trial of Sinemet.  In addition, she was advised to start clonazepam at night.  She called in the interim reporting that her PCP did not want her on both Xanax and clonazepam and we have discussed this as well.  She was advised to discuss this matter with her primary care physician and see if she could go without the Xanax during the day and continue with the clonazepam at night.    I saw her on 11/21/2018 in a virtual visit, at which time she reported side effects with Mirapex including weight  gain and not sleeping well at night.  I suggested we switch her over to the Neupro patch, 2 mg strength.   I saw her on 06/26/2018, At which time she reported feeling better, she reported that the Mirapex was helpful.  She had stopped a new antidepressant and started generic Celexa.  Her anxiety was better.  She was able to tolerate the Mirapex.   I saw her on 03/27/2018, at which time she reported still struggling with her anxiety and depressive symptoms. We talked about her DaT scan results. She had tried multiple different antidepressants in the past, she was given most recently a sample for Trintellix by her PCP but had not started it. She was advised to start it.   She was also advised to start Mirapex with gradual increase. She called in the interim in November 2019 reporting that Mirapex was not making a big difference. She was advised that she was on a low-dose of advised to increase it. She had also called in September 2019 reporting that she was not able to tolerate the antidepressant that was given to her by her PCP. She was advised to talk to her family doctor about alternative treatment options for anxiety and depression. She had reduced her Xanax.     I first met her on 12/13/2017 at the request of her primary care provider, at which time she reported a bilateral hand tremor of at least 2 years duration as well as fine motor dyscontrol. Right-sided symptoms were worse on the left. Her examination was concerning for parkinsonism with right-sided lateralization. I suggested we proceed with a brain MRI and we subsequently also proceeded with a DaT scan.  She had a brain MRI with and without contrast on 12/27/2017 and I reviewed the results: IMPRESSION: This MRI of the brain with and without contrast shows the following: 1.    There are some scattered T2/FLAIR hyperintense foci  consistent with mild age-related chronic microvascular ischemic changes 2.    There are no acute findings and there is  a normal enhancement pattern.   We called her with her test results.  She had a nuclear medicine DaT scan on 03/21/2018 and a very tight results: IMPRESSION: Near absent radiotracer activity within the bilateral posterior striata (putamen) in a pattern typical of a Parkinson's syndrome. pathology. There is mild decreased relative activity in the head of the LEFT caudate nucleus compared to the RIGHT.   We called her with her test results and scheduled her for a sooner than scheduled appointment.   12/13/2017: (She) reports a right more than left hand tremor for the past 2 years, worse in the past few months. She has noticed other issues including fine motor dyscontrol, balance issues, mild short-term memory loss. 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 She has never considered seeing ENT for this. She has suboptimally controlled anxiety. She has been on Lexapro in the past which caused significant GI related side effects and for the past 2 months she has been on sertraline. She does not feel that the sertraline has been helpful. She has been on Xanax off and on since the late 90s when she had to take care of her father. She has been on Xanax consistently 3 times a day for the past couple of years, since 2016 or so. She is currently on sertraline 100 mg daily and alprazolam 0.5 mg 3 times a day. She has had significant weight loss and had multiple tests for this including endoscopy and CAT scan all per her report without significant abnormality is thankfully. She is using some protein milkshakes to supplement her diet. Her tremor is at rest as well as with activity at times. It is more noticeable on the right, she has also noticed difficulty with fine motor tasks also more on the right. She fell recently once in the yard, as she was standing up from the squatting position and thankfully did not hurt herself.I reviewed your office note from 10/29/2017. She has no family history of  tremors but reports a family history of Parkinson's disease, her father's first cousin apparently had Parkinson's disease, another first cousin of her father had some other neurological disease, maybe MS. She has worried about MS. She saw a neurologist in [place], [place] in August 2018 told her she does not have MS. She is a nonsmoker and does not drink alcohol and drinks caffeine one serving per day on average. She is married and lives with her husband, they have 1 daughter. 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  Her Past Medical History Is Significant For: Past Medical History:  Diagnosis Date   Anxiety disorder    GERD (gastroesophageal reflux disease)    High cholesterol    IBS (irritable bowel syndrome)    Insomnia     Her Past Surgical History Is Significant For: Past Surgical History:  Procedure Laterality Date   CHOLECYSTECTOMY      Her Family History Is Significant For: Family History  Problem Relation Age of Onset   Parkinson's disease Paternal Uncle    Parkinson's disease Paternal Uncle    Tremor Neg Hx     Her Social History Is Significant For: Social History   Socioeconomic History   Marital status: Married    Spouse name: Not on file   Number of  children: Not on file   Years of education: Not on file   Highest education level: Not on file  Occupational History   Not on file  Tobacco Use   Smoking status: Never   Smokeless tobacco: Never  Vaping Use   Vaping Use: Never used  Substance and Sexual Activity   Alcohol use: Never   Drug use: Never   Sexual activity: Not on file  Other Topics Concern   Not on file  Social History Narrative   Not on file   Social Determinants of Health   Financial Resource Strain: Not on file  Food Insecurity: Not on file  Transportation Needs: Not on file  Physical Activity: Not on file  Stress: Not on file  Social Connections: Not on file    Her  Allergies Are:  Allergies  Allergen Reactions   Prozac [Fluoxetine] Other (See Comments)    Upset Stomach  :   Her Current Medications Are:  Outpatient Encounter Medications as of 01/31/2022  Medication Sig   carbidopa-levodopa (SINEMET CR) 50-200 MG tablet Take 1 tablet by mouth at bedtime. Take at 9 PM (1-2 h before bedtime)   carbidopa-levodopa (SINEMET IR) 25-100 MG tablet Take 1 tablet by mouth 5 (five) times daily. Take at 7, 10, 1 PM, 4 PM, and 7 PM.   clonazePAM (KLONOPIN) 1 MG disintegrating tablet Take 1 tablet (1 mg total) by mouth at bedtime. (Patient taking differently: Take 1 mg by mouth as needed. Pt takes 2 tablets at bedtime PRN)   FLUoxetine (PROZAC) 20 MG capsule Take 1 capsule (20 mg total) by mouth daily.   busPIRone (BUSPAR) 10 MG tablet Take 1 pill in the morning and 2 pills in the afternoon as needed for anxiety (Patient not taking: Reported on 01/31/2022)   clonazePAM (KLONOPIN) 0.25 MG disintegrating tablet Take 1 tablet (0.25 mg total) by mouth at bedtime as needed. Take in the middle of the night as needed. (Patient not taking: Reported on 01/31/2022)   cyclobenzaprine (FLEXERIL) 10 MG tablet Take 1 tablet (10 mg total) by mouth at bedtime as needed for muscle spasms. (Patient not taking: Reported on 01/31/2022)   MELATONIN PO Take by mouth. (Patient not taking: Reported on 01/31/2022)   No facility-administered encounter medications on file as of 01/31/2022.  :  Review of Systems:  Out of a complete 14 point review of systems, all are reviewed and negative with the exception of these symptoms as listed below:  Review of Systems  Neurological:        Pt here for parkinson's follow up   pt states she has questions about her medications     Objective:  Neurological Exam  Physical Exam Physical Examination:   Vitals:   01/31/22 0748  BP: 111/74  Pulse: 71    General Examination: The patient is a very pleasant 71 y.o. female in no acute distress. She  appears well-developed and well-nourished and well groomed.  Good spirits.  HEENT: Normocephalic, atraumatic, pupils are equal, round and reactive to light, has corrective eyeglasses, bilateral cataracts noted.  Extraocular tracking is fairly well-preserved, moderate facial masking is noted. No significant lip or jaw tremor is noted today. Airway examination is stable. She has minimal hypophonia, no dysarthria, mild nuchal rigidity, which is stable.  No new issues, good tongue movements.   Chest: Clear to auscultation without wheezing, rhonchi or crackles noted.   Heart: S1+S2+0, regular and normal without murmurs, rubs or gallops noted.    Abdomen: Soft, non-tender  and non-distended.   Extremities: There is no pitting edema in the distal lower extremities bilaterally.    Skin: Warm and dry without trophic changes noted.    Musculoskeletal: exam reveals no obvious joint deformities.    Neurologically:  Mental status: The patient is awake, alert and oriented in all 4 spheres. Her immediate and remote memory, attention, language skills and fund of knowledge are good. Mood and affect normal.    Cranial nerves II - XII are as described above under HEENT exam. Motor exam: Normal bulk, and strength are noted. She has mild increase in tone in the right more than left upper extremity. There is a mild intermittent resting tremor in the right hand. Overall mild bradykinesia, stable.   (On 12/13/2017: On Archimedes spiral drawing she has slight insecurity with both hands, no obvious tremor though. Handwriting is legible, not particularly tremulous, but mildly micrographic.)    She has a slight postural tremor in both upper extremities, she has no significant action tremor, no intention tremor. On fine motor testing, she has mild to moderate difficulty on the right, better on the left.   Cerebellar testing: No dysmetria or intention tremor. There is no truncal or gait ataxia. Sensory exam: intact to  light touch.  Gait, station and balance: She stands without difficulty, posture is slightly stooped for age, slight tilt to the R, left shoulder perhaps slightly higher than right. She walks with a slightly decreased stride length and good pace, decreased arm swing on the right more than left.  She turns quite well, balance is preserved.    Assessment and Plan:  In summary, Paula Fuller is a very pleasant 71 year old female ith an underlying medical history of anxiety, depression, irritable bowel syndrome, weight loss, prior history of hyperlipidemia and reflux disease, who presents for follow-up consultation of her right-sided predominant Parkinson's disease, with symptoms dating back to about 4 years ago.  Her DaT scan in 09/19 supported the diagnosis. Her brain MRI in 06/19 showed age-appropriate findings. She started treatment with Mirapex low-dose and initially did quite well, noticed an improvement in her tremor and her exam had improved.  However, she was having more trouble with taking 0.25 mg strength 2 pills 3 times daily. She also gained weight while on it.  She was advised to discontinue Mirapex and start Neupro patch in May 2020.  She did not have telltale results from the Neupro, had ongoing issues with swelling and weight gain, also headaches. We stopped the Neupro patch and she started Sinemet in August 2020. We increased this to 1 pill 5 times a day in October 2021.  She has had some dizziness from it, but overall tolerates it and has done well with it.  She has been on clonazepam 0.5 mg each night since October 2020. She had been on Xanax before but no longer takes it.  She has struggled with anxiety and depression for years.  She has been on several different medications, could not tolerate Celexa which we tried, and has been on low dose Prozac since February 2021, but tapered off of it due to GI side effects. She started having increased dream enactment behavior.  For increased anxiety, we  increased BuSpar to 5 mg 3 times daily in October 2021.  She has had some residual evening anxiety, particularly prior to bedtime.  She was encouraged to increase the BuSpar to 10 mg at bedtime in March 2022. We restarted Prozac at 20 mg strength in 09/22. She started  Flexeril 10 mg at bedtime as needed in September 2022.  He currently no longer has to take the Flexeril.  She also stopped taking the BuSpar sometime earlier in 2023 due to less stress and anxiety.  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  We increased her clonazepam to 1 mg at bedtime in October 2021 and she increase this further to 2 mg at bedtime on her own in or around May or June 2023.  She can maintain a 2 mg strength, she did not utilize the 0.25 mg strength of clonazepam which I prescribed in January 2023.  We started the Sinemet CR at bedtime in May 2021.  She is doing well at this time, her medication regimen is working well for her.  She will maintain Sinemet IR 5 times a day and Sinemet CR at bedtime.  I explained to her that the Sinemet CR does not kick in as quickly and does not maintain most patients evenly throughout the day which is why we are using 2 different formulations and 2 different strengths of Sinemet.  She had some questions especially since her husband had questioned why she is taking 2 different kinds of levodopa.   We talked about the importance of fall prevention, staying active mentally and physically, maintaining healthy weight and hydrating well with water, staying proactive about constipation issues.  She is active mentally and physically.  She is advised to follow-up routinely in 6 months, sooner if needed. I provided a printed prescription for clonazepam as per her request, for a total of 2 mg at bedtime. I answered all her questions today and she was in agreement.  I spent 45 minutes in total face-to-face time and in reviewing records during pre-charting, more than  50% of which was spent in counseling and coordination of care, reviewing test results, reviewing medications and treatment regimen and/or in discussing or reviewing the diagnosis of PD, the prognosis and treatment options. Pertinent laboratory and imaging test results that were available during this visit with the patient were reviewed by me and considered in my medical decision making (see chart for details).

## 2022-06-22 ENCOUNTER — Other Ambulatory Visit: Payer: Self-pay | Admitting: Neurology

## 2022-06-22 DIAGNOSIS — G4752 REM sleep behavior disorder: Secondary | ICD-10-CM

## 2022-06-22 DIAGNOSIS — F419 Anxiety disorder, unspecified: Secondary | ICD-10-CM

## 2022-06-22 DIAGNOSIS — M6289 Other specified disorders of muscle: Secondary | ICD-10-CM

## 2022-06-22 DIAGNOSIS — G20A1 Parkinson's disease without dyskinesia, without mention of fluctuations: Secondary | ICD-10-CM

## 2022-06-22 NOTE — Telephone Encounter (Signed)
Attempted to call patient and LVM (okay per DPR) in regards to the medication buspirone that was prescribed at her visit on 08/02/2021. A request for a refill was sent although she reported not taking the medication at her next appointment on 01/31/2022. The call was to clear up whether or not she is taking the med and needs the refill prior to her next appointment on 08/03/2022.

## 2022-06-22 NOTE — Telephone Encounter (Signed)
Patient returned call, she stated that she just had refill in November that will last her until her next appointment, does not need refill at this time.

## 2022-07-22 ENCOUNTER — Other Ambulatory Visit: Payer: Self-pay | Admitting: Neurology

## 2022-07-22 DIAGNOSIS — M6289 Other specified disorders of muscle: Secondary | ICD-10-CM

## 2022-07-22 DIAGNOSIS — G20A1 Parkinson's disease without dyskinesia, without mention of fluctuations: Secondary | ICD-10-CM

## 2022-07-22 DIAGNOSIS — G4752 REM sleep behavior disorder: Secondary | ICD-10-CM

## 2022-07-22 DIAGNOSIS — F32A Depression, unspecified: Secondary | ICD-10-CM

## 2022-08-02 HISTORY — PX: CATARACT EXTRACTION: SUR2

## 2022-08-03 ENCOUNTER — Ambulatory Visit: Payer: Medicare PPO | Admitting: Neurology

## 2022-08-05 ENCOUNTER — Other Ambulatory Visit: Payer: Self-pay | Admitting: Neurology

## 2022-08-05 DIAGNOSIS — G4752 REM sleep behavior disorder: Secondary | ICD-10-CM

## 2022-08-05 DIAGNOSIS — F32A Depression, unspecified: Secondary | ICD-10-CM

## 2022-08-05 DIAGNOSIS — G20A1 Parkinson's disease without dyskinesia, without mention of fluctuations: Secondary | ICD-10-CM

## 2022-08-08 MED ORDER — CLONAZEPAM 1 MG PO TBDP: 2.0000 mg | ORAL_TABLET | Freq: Every day | ORAL | 1 refills | Status: DC

## 2022-08-16 ENCOUNTER — Encounter: Payer: Self-pay | Admitting: Neurology

## 2022-08-16 ENCOUNTER — Ambulatory Visit: Payer: Medicare PPO | Admitting: Neurology

## 2022-08-16 VITALS — BP 128/80 | HR 76 | Ht 62.0 in | Wt 136.0 lb

## 2022-08-16 DIAGNOSIS — G4752 REM sleep behavior disorder: Secondary | ICD-10-CM

## 2022-08-16 DIAGNOSIS — G20A2 Parkinson's disease without dyskinesia, with fluctuations: Secondary | ICD-10-CM

## 2022-08-16 DIAGNOSIS — F419 Anxiety disorder, unspecified: Secondary | ICD-10-CM

## 2022-08-16 DIAGNOSIS — G20A1 Parkinson's disease without dyskinesia, without mention of fluctuations: Secondary | ICD-10-CM

## 2022-08-16 DIAGNOSIS — M6289 Other specified disorders of muscle: Secondary | ICD-10-CM | POA: Diagnosis not present

## 2022-08-16 DIAGNOSIS — F32A Depression, unspecified: Secondary | ICD-10-CM

## 2022-08-16 MED ORDER — FLUOXETINE HCL 20 MG PO CAPS: 20.0000 mg | ORAL_CAPSULE | Freq: Every day | ORAL | 3 refills | Status: DC

## 2022-08-16 MED ORDER — CARBIDOPA-LEVODOPA 25-100 MG PO TABS: 1.0000 | ORAL_TABLET | Freq: Every day | ORAL | 3 refills | Status: DC

## 2022-08-16 NOTE — Patient Instructions (Signed)
It was nice to see you again today.  You look great!  Good luck with your upcoming cataract surgery, keep up the good work!  We will maintain your medication regimen as discussed.  Follow-up in 6 months, sooner if needed.

## 2022-08-16 NOTE — Progress Notes (Signed)
Subjective:    Patient ID: Paula Fuller is a 72 y.o. female.  HPI    Interim history:    Paula Fuller is a 72 year old right-handed woman with an underlying medical history of anxiety, depression, irritable bowel syndrome, history of hyperlipidemia and reflux disease, who presents for follow-up consultation of her R sided parkinsonism, complicated by anxiety, sleep disturbance including dream enactment behavior, weight fluctuation and GI symptoms. The patient is unaccompanied today. I last saw her on 01/31/2022, at which time she reported sleeping better and she reported doing better.  She was taking clonazepam 2 mg at bedtime which was helping her vivid dreams and dream enactment.  She felt that she did not need the BuSpar any longer.  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  She was offered a full-time position but declined at.  She was tolerating low-dose Prozac.  She was on Sinemet CR at bedtime and Sinemet IR 5 times a day at 3-hour intervals.  She was taking a probiotic daily.  She was advised to continue with her medication regimen.   Today, 08/16/2022: She reports doing quite well, no serious interim issues, stress level okay, anxiety better overall.  She stopped taking the Sinemet CR shortly after our last appointment and, somewhere in September 2023.  She felt that it made her hyped up.  She is currently taking Sinemet IR 5 times a day and Prozac in the morning, clonazepam 2 mg at bedtime.  Clonazepam helps, she is aware that it is a controlled substance, she is cautious with it, she has read the side effects and got somewhat concerned, she is worried about having seizures, she is reassured that if anything she might be at risk to some degree for withdrawal seizures if she were to stop it cold [place] for several days.  She does sometimes try to go with only 1 pill which is 1 mg at night but often takes the 2 pills which have been helpful,  melatonin did not help her dream enactment behavior and vivid dreams and caused her to have a headache.  She does not actually take the BuSpar, she does well with it as needed during the day but when she took it in the afternoon especially at 2 pills, she had nausea and even vomiting from it.  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  She finds purpose in this and is really doing well with that.  She had successful right eye cataract surgery a couple of weeks ago and is due for her left eye on 09/02/2022.                                                          The patient's allergies, current medications, family history, past medical history, past social history, past surgical history and problem list were reviewed and updated as appropriate.    Previously:   I saw her on 08/02/2021, at which time she reported doing well overall but still had some difficulty maintaining sleep and intermittent issues with sleep talking and dream enactment behavior per husband's feedback.  Mood wise she felt a lot better compared to few months prior.  I suggested  adding a small dose of clonazepam 0.25 mg in the middle of the night, preferably no later than 3:30 AM.  She was also advised to add half of a dose if she were to wake up more closer to 4 AM.  She was advised to continue with her clonazepam 1 mg at bedtime, Sinemet CR at bedtime, and Sinemet IR 1 pill 5 times a day.  She was advised to continue with Flexeril 10 mg at bedtime as needed, continue with generic Prozac 20 mg daily and BuSpar 10 mg in the morning and 20 mg in the afternoon.     I saw her on 03/23/21, at which time she reported doing fairly well motor wise.  She had residual anxiety.  We mutually agreed to increase her BuSpar.  We also restarted her Prozac.  She was advised to continue with Sinemet and Sinemet CR.  She was using melatonin at night.  She had  occasional neck stiffness and shoulder girdle stiffness and had tried Flexeril 10 mg strength from her husband.  I did prescribe Flexeril for her for as needed use.  She was advised not to use her husband's medication though.  She was active physically.     I saw her on 09/20/20, at which time she reported feeling better.  She had been more consistent with her medications.  She did have residual anxiety especially prior to bedtime.  She was on BuSpar 5 mg 3 times daily consistently.  She was on clonazepam at night and melatonin 5 mg at night.  She was encouraged to try to increase the melatonin to up to 10 mg at night.  She was also advised to increase her evening BuSpar dose to 10 mg and keep the morning and midday doses at 5 mg.  She was advised to continue with the Sinemet CR at night and Sinemet regular release 1 pill 5 times a day.  She found that the increase to the 5 times a day schedule for her IR Sinemet was helpful.     I saw her on 05/07/20, at which time she reported that she had worsening dream and acting behavior.  Of note, she tapered herself off her medications but had restarted the clonazepam and the Sinemet CR.  She was advised to be consistent with her medication, she was advised to start BuSpar, she was off of Prozac and she was advised to be consistent with clonazepam.  We increased her Sinemet IR to 1 pill 5 times a day at the time.     I saw her on 11/19/19, At which time she reported that her mood was better.  Prozac was helping, she was on 10 mg strength.  BuSpar was helping for situational anxiety and she was taking it infrequently.  She was on Sinemet 4 times a day.  She reported that she was having a harder time getting through the night with her motor symptoms flaring up in the middle of the night.  She was advised to continue with all her medications but add Sinemet CR at bedtime.  We talked about adding Comtan next.  We also talked about the possibility of just moving her Sinemet  IR closer together to add a 5th dose to her regimen.  She emailed recently in September 2021 reporting that she had stopped most of her medications for concern for side effects. She reported having more dream enactment behavior as witnessed by her husband.  She thought that these became worse in the previous  3 months and therefore tapered herself off of most of her meds. She had stopped the Sinemet CR, Prozac, clonazepam.  She was advised to start back on the Sinemet CR and clonazepam and continue with Sinemet IR.   I saw her on 08/21/2019, at which time she reported significant increase in her stress.  She was unable to tolerate Celexa in the recent past and had also tried Lexapro.  In the distant past she had tried Prozac and we mutually agreed to try low-dose Prozac and for anxiety I suggested BuSpar and low-dose with gradual increase, as needed.  She was advised to continue with her Sinemet.  She declined a referral to psychiatry.  She also declined a referral for a second opinion for her Parkinson's disease.     I saw her on 05/21/2019, at which time she felt improvement with the Sinemet.  She was tolerating it okay.  She had improvement in her headaches after she stopped the Neupro patch.  She had significant issues with anxiety and also depression.  She had weight gain in the past with sertraline.  I suggested a trial of Celexa.  I also suggested she increase the Sinemet to 4 times a day.     She emailed in the interim in early December 2020 indicating that the Sinemet 4 times a day felt too much, she felt dizzy.  She was advised to reduce it back to 3 times daily.  She also reported side effects from the Celexa including nausea and vomiting and appetite loss and was therefore advised to stop it.         I saw her on 02/17/2019, at which time she reported no significant results from the Neupro, had ongoing issues with weight gain and swelling and also felt that she had developed some headaches after  starting the Neupro patch.  She reported dream enactment behaviors.  I suggested she stop the Neupro and start a trial of Sinemet.  In addition, she was advised to start clonazepam at night.  She called in the interim reporting that her PCP did not want her on both Xanax and clonazepam and we have discussed this as well.  She was advised to discuss this matter with her primary care physician and see if she could go without the Xanax during the day and continue with the clonazepam at night.    I saw her on 11/21/2018 in a virtual visit, at which time she reported side effects with Mirapex including weight gain and not sleeping well at night.  I suggested we switch her over to the Neupro patch, 2 mg strength.   I saw her on 06/26/2018, At which time she reported feeling better, she reported that the Mirapex was helpful.  She had stopped a new antidepressant and started generic Celexa.  Her anxiety was better.  She was able to tolerate the Mirapex.   I saw her on 03/27/2018, at which time she reported still struggling with her anxiety and depressive symptoms. We talked about her DaT scan results. She had tried multiple different antidepressants in the past, she was given most recently a sample for Trintellix by her PCP but had not started it. She was advised to start it.   She was also advised to start Mirapex with gradual increase. She called in the interim in November 2019 reporting that Mirapex was not making a big difference. She was advised that she was on a low-dose of advised to increase it. She had also called  in September 2019 reporting that she was not able to tolerate the antidepressant that was given to her by her PCP. She was advised to talk to her family doctor about alternative treatment options for anxiety and depression. She had reduced her Xanax.     I first met her on 12/13/2017 at the request of her primary care provider, at which time she reported a bilateral hand tremor of at least 2  years duration as well as fine motor dyscontrol. Right-sided symptoms were worse on the left. Her examination was concerning for parkinsonism with right-sided lateralization. I suggested we proceed with a brain MRI and we subsequently also proceeded with a DaT scan.  She had a brain MRI with and without contrast on 12/27/2017 and I reviewed the results: IMPRESSION: This MRI of the brain with and without contrast shows the following: 1.    There are some scattered T2/FLAIR hyperintense foci consistent with mild age-related chronic microvascular ischemic changes 2.    There are no acute findings and there is a normal enhancement pattern.   We called her with her test results.  She had a nuclear medicine DaT scan on 03/21/2018 and a very tight results: IMPRESSION: Near absent radiotracer activity within the bilateral posterior striata (putamen) in a pattern typical of a Parkinson's syndrome. pathology. There is mild decreased relative activity in the head of the LEFT caudate nucleus compared to the RIGHT.   We called her with her test results and scheduled her for a sooner than scheduled appointment.   12/13/2017: (She) reports a right more than left hand tremor for the past 2 years, worse in the past few months. She has noticed other issues including fine motor dyscontrol, balance issues, mild short-term memory loss. 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 She has never considered seeing ENT for this. She has suboptimally controlled anxiety. She has been on Lexapro in the past which caused significant GI related side effects and for the past 2 months she has been on sertraline. She does not feel that the sertraline has been helpful. She has been on Xanax off and on since the late 90s when she had to take care of her father. She has been on Xanax consistently 3 times a day for the past couple of years, since 2016 or so. She is currently on sertraline 100 mg daily and alprazolam 0.5 mg  3 times a day. She has had significant weight loss and had multiple tests for this including endoscopy and CAT scan all per her report without significant abnormality is thankfully. She is using some protein milkshakes to supplement her diet. Her tremor is at rest as well as with activity at times. It is more noticeable on the right, she has also noticed difficulty with fine motor tasks also more on the right. She fell recently once in the yard, as she was standing up from the squatting position and thankfully did not hurt herself.I reviewed your office note from 10/29/2017. She has no family history of tremors but reports a family history of Parkinson's disease, her father's first cousin apparently had Parkinson's disease, another first cousin of her father had some other neurological disease, maybe MS. She has worried about MS. She saw a neurologist in [place], [place] in August 2018 told her she does not have MS. She is a nonsmoker and does not drink alcohol and drinks caffeine one serving per day on average. She is married and lives with her husband,  they have 1 daughter. 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    Her Past Medical History Is Significant For: Past Medical History:  Diagnosis Date   Anxiety disorder    GERD (gastroesophageal reflux disease)    High cholesterol    IBS (irritable bowel syndrome)    Insomnia     Her Past Surgical History Is Significant For: Past Surgical History:  Procedure Laterality Date   CATARACT EXTRACTION Right    CHOLECYSTECTOMY      Her Family History Is Significant For: Family History  Problem Relation Age of Onset   Parkinson's disease Paternal Uncle    Parkinson's disease Paternal Uncle    Tremor Neg Hx     Her Social History Is Significant For: Social History   Socioeconomic History   Marital status: Married    Spouse name: Not on file   Number of children: Not on file    Years of education: Not on file   Highest education level: Not on file  Occupational History   Not on file  Tobacco Use   Smoking status: Never   Smokeless tobacco: Never  Vaping Use   Vaping Use: Never used  Substance and Sexual Activity   Alcohol use: Never   Drug use: Never   Sexual activity: Not on file  Other Topics Concern   Not on file  Social History Narrative   Not on file   Social Determinants of Health   Financial Resource Strain: Not on file  Food Insecurity: Not on file  Transportation Needs: Not on file  Physical Activity: Not on file  Stress: Not on file  Social Connections: Not on file    Her Allergies Are:  Allergies  Allergen Reactions   Prozac [Fluoxetine] Other (See Comments)    Upset Stomach  :   Her Current Medications Are:  Outpatient Encounter Medications as of 08/16/2022  Medication Sig   clonazePAM (KLONOPIN) 1 MG disintegrating tablet Take 2 tablets (2 mg total) by mouth at bedtime.   Multiple Vitamin (MULTIVITAMIN PO) Take by mouth.   VITAMIN D PO Take by mouth.   [DISCONTINUED] carbidopa-levodopa (SINEMET IR) 25-100 MG tablet Take 1 tablet by mouth 5 (five) times daily. Take at 7, 10, 1 PM, 4 PM, and 7 PM.   [DISCONTINUED] FLUoxetine (PROZAC) 20 MG capsule Take 1 capsule (20 mg total) by mouth daily.   busPIRone (BUSPAR) 10 MG tablet TAKE 1 PILL IN THE MORNING AND 2 PILLS IN THE AFTERNOON AS NEEDED FOR ANXIETY   carbidopa-levodopa (SINEMET IR) 25-100 MG tablet Take 1 tablet by mouth 5 (five) times daily. Take at 7, 10, 1 PM, 4 PM, and 7 PM.   FLUoxetine (PROZAC) 20 MG capsule Take 1 capsule (20 mg total) by mouth daily.   MELATONIN PO Take by mouth.   [DISCONTINUED] carbidopa-levodopa (SINEMET CR) 50-200 MG tablet Take 1 tablet by mouth at bedtime. Take at 9 PM (1-2 h before bedtime)   [DISCONTINUED] cyclobenzaprine (FLEXERIL) 10 MG tablet Take 1 tablet (10 mg total) by mouth at bedtime as needed for muscle spasms.   No  facility-administered encounter medications on file as of 08/16/2022.  :  Review of Systems:  Out of a complete 14 point review of systems, all are reviewed and negative with the exception of these symptoms as listed below:  Review of Systems  Neurological:        Pt here for Parkinson's  f/u  Pt states at times balance is off,some double vision     Objective:  Neurological Exam  Physical Exam Physical Examination:   Vitals:   08/16/22 1118  BP: 128/80  Pulse: 76    General Examination: The patient is a very pleasant 72 y.o. female in no acute distress. She appears well-developed and well-nourished and well groomed.   HEENT: Normocephalic, atraumatic, pupils are equal, round and reactive to light, has corrective eyeglasses, left eye cataract noted, right eye with status post cataract surgery, no significant irritation, no light sensitivity.  Extraocular tracking is fairly well-preserved, moderate facial masking is noted. No significant lip or jaw tremor is noted today. Airway examination is stable. She has minimal hypophonia, no dysarthria, mild nuchal rigidity, which is stable.  No new issues, good tongue movements.   Chest: Clear to auscultation without wheezing, rhonchi or crackles noted.   Heart: S1+S2+0, regular and normal without murmurs, rubs or gallops noted.    Abdomen: Soft, non-tender and non-distended.   Extremities: There is no pitting edema in the distal lower extremities bilaterally.    Skin: Warm and dry without trophic changes noted.    Musculoskeletal: exam reveals no obvious joint deformities.    Neurologically:  Mental status: The patient is awake, alert and oriented in all 4 spheres. Her immediate and remote memory, attention, language skills and fund of knowledge are good. Mood and affect normal.  Good spirits.   Cranial nerves II - XII are as described above under HEENT exam. Motor exam: Normal bulk, and strength are noted. She has mild increase in tone  in the right more than left upper extremity. There is a very mild intermittent resting tremor in the right hand. Overall mild bradykinesia, stable.   (On 12/13/2017: On Archimedes spiral drawing she has slight insecurity with both hands, no obvious tremor though. Handwriting is legible, not particularly tremulous, but mildly micrographic.)    She has a slight postural tremor in both upper extremities, she has no significant action tremor, no intention tremor. On fine motor testing, she has mild to moderate difficulty on the right, better on the left.   Cerebellar testing: No dysmetria or intention tremor. There is no truncal or gait ataxia. Sensory exam: intact to light touch.  Gait, station and balance: She stands without difficulty, posture is slightly stooped for age, slight tilt to the R, left shoulder perhaps slightly higher than right, all stable. She walks with a slightly decreased stride length and good pace, decreased arm swing on the right more than left.  She turns quite well, balance is preserved.    Assessment and Plan:  In summary, Garyn Waguespack is a very pleasant 72 year old female ith an underlying medical history of anxiety, depression, irritable bowel syndrome, weight loss, prior history of hyperlipidemia and reflux disease, who presents for follow-up consultation of her right-sided predominant Parkinson's disease, with symptoms dating back to about 4 years ago.  Her DaT scan in 09/19 supported the diagnosis. Her brain MRI in 06/19 showed age-appropriate findings. She started treatment with Mirapex low-dose and initially did quite well, noticed an improvement in her tremor and her exam had improved.  However, she was having more trouble with taking 0.25 mg strength 2 pills 3 times daily. She also gained weight while on it.  She was advised to discontinue Mirapex and start Neupro patch in May 2020.  She did not have telltale results from the Neupro, had ongoing issues with swelling and  weight gain, also  headaches. We stopped the Neupro patch and she started Sinemet in August 2020. We increased this to 1 pill 5 times a day in October 2021.  She has had some dizziness from it, but overall tolerates it and has done well with it.  She has been on clonazepam 0.5 mg each night since October 2020. She had been on Xanax before but no longer takes it.  She has struggled with anxiety and depression for years.  She has been on several different medications, could not tolerate Celexa which we tried, and has been on low dose Prozac since February 2021, but tapered off of it due to GI side effects. She started having increased dream enactment behavior.  She tried melatonin for this but it did not help and she had headaches from the melatonin.  For increased anxiety, we increased BuSpar to 5 mg 3 times daily in October 2021.  She has had some residual evening anxiety, particularly prior to bedtime.  She was encouraged to increase the BuSpar to 10 mg at bedtime in March 2022. We restarted Prozac at 20 mg strength in 09/22. She started Flexeril 10 mg at bedtime as needed in September 2022 for muscle cramps due to restlessness but no longer has to take it.  She also stopped taking the BuSpar sometime earlier in 2023 due to less stress and anxiety.  She had some nausea and even vomiting from BuSpar in the afternoon but has it still at home for as needed use and would like to have it as a backup for anxiety flare to be used once daily.  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 We increased her clonazepam to 1 mg at bedtime in October 2021 and she increase this further to 2 mg at bedtime on her own in or around May or June 2023.  Sometimes she tries to take just the 1 mg at night.  We talked about precautions with high risk medications such as benzodiazepine medication especially once we get to a certain age.  She is mindful of this, she has had no withdrawal  symptoms and is reassured that she is currently still on a safe dose and can take 1 mg to up to 2 mg at night for sleep, particularly her dream enactment behavior. We started the Sinemet CR at bedtime in May 2021 but she stopped it in September 2023, she felt that it made her more hyper.  She is doing well at this time, her medication regimen is working well for her.  She will maintain Sinemet IR 5 times a day, Prozac once daily, BuSpar 10 mg as needed once daily, clonazepam 1 mg strength 1 to 2 tablets at bedtime.  She is active mentally and physically.  She is advised to follow-up routinely in 6 months, sooner if needed. I answered all her questions today and she was in agreement.  I spent 30 minutes in total face-to-face time and in reviewing records during pre-charting, more than 50% of which was spent in counseling and coordination of care, reviewing test results, reviewing medications and treatment regimen and/or in discussing or reviewing the diagnosis of PD, the prognosis and treatment options. Pertinent laboratory and imaging test results that were available during this visit with the patient were reviewed by me and considered in my medical decision making (see chart for details).

## 2022-09-29 ENCOUNTER — Other Ambulatory Visit: Payer: Self-pay | Admitting: Neurology

## 2022-10-04 NOTE — Telephone Encounter (Signed)
Pharmacy asking for sinimet  50-200mg but the one we have on file is 25-100mg . Routing to provider to clarify which they prefer to send

## 2022-10-05 NOTE — Telephone Encounter (Signed)
At her last appointment patient stated she stopped taking the Sinemet CR, no need to refill.

## 2022-12-07 ENCOUNTER — Telehealth: Payer: Self-pay | Admitting: Neurology

## 2022-12-07 ENCOUNTER — Encounter: Payer: Self-pay | Admitting: Neurology

## 2022-12-07 NOTE — Telephone Encounter (Signed)
LVM and sent letter in mail informing pt of need to reschedule 02/12/23 appt - MD out 

## 2023-01-23 ENCOUNTER — Encounter: Payer: Self-pay | Admitting: Neurology

## 2023-01-23 ENCOUNTER — Ambulatory Visit: Payer: Medicare PPO | Admitting: Neurology

## 2023-01-23 VITALS — BP 131/84 | HR 83 | Ht 62.0 in | Wt 146.0 lb

## 2023-01-23 DIAGNOSIS — R5383 Other fatigue: Secondary | ICD-10-CM

## 2023-01-23 DIAGNOSIS — G20A2 Parkinson's disease without dyskinesia, with fluctuations: Secondary | ICD-10-CM | POA: Diagnosis not present

## 2023-01-23 DIAGNOSIS — G479 Sleep disorder, unspecified: Secondary | ICD-10-CM | POA: Diagnosis not present

## 2023-01-23 DIAGNOSIS — F419 Anxiety disorder, unspecified: Secondary | ICD-10-CM

## 2023-01-23 DIAGNOSIS — R0683 Snoring: Secondary | ICD-10-CM | POA: Diagnosis not present

## 2023-01-23 DIAGNOSIS — G4752 REM sleep behavior disorder: Secondary | ICD-10-CM | POA: Diagnosis not present

## 2023-01-23 DIAGNOSIS — R519 Headache, unspecified: Secondary | ICD-10-CM

## 2023-01-23 MED ORDER — BUPROPION HCL ER (XL) 150 MG PO TB24: 150.0000 mg | ORAL_TABLET | Freq: Every morning | ORAL | 3 refills | Status: DC

## 2023-01-23 MED ORDER — CARBIDOPA-LEVODOPA 25-100 MG PO TABS: 1.0000 | ORAL_TABLET | Freq: Every day | ORAL | 3 refills | Status: DC

## 2023-01-23 NOTE — Patient Instructions (Addendum)
It was nice to see you again. I am sorry, you have had trouble with sleep. Here is what we discussed today:   Stop buspar, by taking 1 pill twice daily for 3 days, then 1 pill once daily x 3 days, then stop completely.  For anxiety: start Wellbutrin XL 150 mg daily in AM. For sleep, we may consider Trazodone 50 mg at bedtime (call or email in about 6-8 weeks for update on sleep). We will do a home sleep test to look for signs for sleep apnea. If you have sleep apnea, I will likely recommend you try an autoPAP machine.  Continue carbidopa-levodopa 1 pill 5 times a day; we may consider going to 1.5 pills 4 times a day.

## 2023-01-23 NOTE — Progress Notes (Signed)
Subjective:    Patient ID: Paula Fuller is a 72 y.o. female.  HPI    Interim history:   Paula Fuller is a 71 year old right-handed woman with an underlying medical history of anxiety, depression, irritable bowel syndrome, history of hyperlipidemia and reflux disease, who presents for follow-up consultation of Paula Fuller R sided parkinsonism, complicated by anxiety, sleep disturbance including dream enactment behavior, weight fluctuation and GI symptoms. The patient is unaccompanied today. I last saw Paula Fuller on 08/16/2022, at which time Paula Fuller reported doing quite well, Paula Fuller felt stable.  Paula Fuller had stopped taking the Sinemet CR as Paula Fuller had nervousness from it.  Paula Fuller was taking Sinemet IR 5 times a day and Prozac in the morning, clonazepam at bedtime.  Paula Fuller was utilizing the BuSpar for anxiety as needed.  Paula Fuller had recent right eye cataract surgery and was supposed to have the left eye done in mid February 2024.  Today, 01/23/2023: Paula Fuller reports doing well motor wise but sleep as Paula Fuller is not doing well.  For the past few months Paula Fuller has had trouble sleeping at night, Paula Fuller is restless, Paula Fuller tosses and turns, thrashes around, even talks in Paula Fuller sleep.  Paula Fuller tried melatonin but it gave Paula Fuller consistently a headache about 30 minutes after Paula Fuller took it, Paula Fuller tried it up to 10 mg strength.  Any over-the-counter sleep aid causes Paula Fuller headaches.  Paula Fuller had some changes in Paula Fuller medication regimen, Paula Fuller weaned herself off of Prozac as Paula Fuller had stomach pain from it.  Paula Fuller has done this before where Paula Fuller took an antidepressant for a few months but could not tolerate it after.  Paula Fuller has been taking BuSpar more regularly, 1 pill in the morning and 2 in the afternoons.  Paula Fuller has not fallen, no significant constipation issues, memory is not as great, Paula Fuller finds herself feeling more forgetful.  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  Paula Fuller tries to  hydrate well with water.  Paula Fuller has gained a little bit of weight.  Paula Fuller finds it quite cumbersome to take Sinemet 5 times a day, Paula Fuller is wondering if we can reduce the dose times and increase the dose.  Paula Fuller endorses some snoring, Paula Fuller has woken up with a headache as well.  Paula Fuller denies night to night nocturia.  Paula Fuller is tired during the day.  The patient's allergies, current medications, family history, past medical history, past social history, past surgical history and problem list were reviewed and updated as appropriate.    Previously:   I saw Paula Fuller on 01/31/2022, at which time Paula Fuller reported sleeping better and Paula Fuller reported doing better.  Paula Fuller was taking clonazepam 2 mg at bedtime which was helping Paula Fuller vivid dreams and dream enactment.  Paula Fuller felt that Paula Fuller did not need the BuSpar any longer.  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  Paula Fuller was offered a full-time position but declined at.  Paula Fuller was tolerating low-dose Prozac.  Paula Fuller was on Sinemet CR at bedtime and Sinemet IR 5 times a day at 3-hour intervals.  Paula Fuller was taking a probiotic daily.  Paula Fuller was advised to continue with Paula Fuller medication regimen.     I saw Paula Fuller on 08/02/2021, at which time Paula Fuller reported doing well overall but still had some difficulty maintaining sleep and intermittent issues with sleep talking and dream enactment  behavior per husband's feedback.  Mood wise Paula Fuller felt a lot better compared to few months prior.  I suggested adding a small dose of clonazepam 0.25 mg in the middle of the night, preferably no later than 3:30 AM.  Paula Fuller was also advised to add half of a dose if Paula Fuller were to wake up more closer to 4 AM.  Paula Fuller was advised to continue with Paula Fuller clonazepam 1 mg at bedtime, Sinemet CR at bedtime, and Sinemet IR 1 pill 5 times a day.  Paula Fuller was advised to continue with Flexeril 10 mg at bedtime as needed, continue with generic Prozac 20 mg daily and BuSpar 10 mg in the morning and 20 mg in the  afternoon.     I saw Paula Fuller on 03/23/21, at which time Paula Fuller reported doing fairly well motor wise.  Paula Fuller had residual anxiety.  We mutually agreed to increase Paula Fuller BuSpar.  We also restarted Paula Fuller Prozac.  Paula Fuller was advised to continue with Sinemet and Sinemet CR.  Paula Fuller was using melatonin at night.  Paula Fuller had occasional neck stiffness and shoulder girdle stiffness and had tried Flexeril 10 mg strength from Paula Fuller husband.  I did prescribe Flexeril for Paula Fuller for as needed use.  Paula Fuller was advised not to use Paula Fuller husband's medication though.  Paula Fuller was active physically.     I saw Paula Fuller on 09/20/20, at which time Paula Fuller reported feeling better.  Paula Fuller had been more consistent with Paula Fuller medications.  Paula Fuller did have residual anxiety especially prior to bedtime.  Paula Fuller was on BuSpar 5 mg 3 times daily consistently.  Paula Fuller was on clonazepam at night and melatonin 5 mg at night.  Paula Fuller was encouraged to try to increase the melatonin to up to 10 mg at night.  Paula Fuller was also advised to increase Paula Fuller evening BuSpar dose to 10 mg and keep the morning and midday doses at 5 mg.  Paula Fuller was advised to continue with the Sinemet CR at night and Sinemet regular release 1 pill 5 times a day.  Paula Fuller found that the increase to the 5 times a day schedule for Paula Fuller IR Sinemet was helpful.     I saw Paula Fuller on 05/07/20, at which time Paula Fuller reported that Paula Fuller had worsening dream enactment behavior.  Of note, Paula Fuller tapered herself off Paula Fuller medications but had restarted the clonazepam and the Sinemet CR.  Paula Fuller was advised to be consistent with Paula Fuller medication, Paula Fuller was advised to start BuSpar, Paula Fuller was off of Prozac and Paula Fuller was advised to be consistent with clonazepam.  We increased Paula Fuller Sinemet IR to 1 pill 5 times a day at the time.     I saw Paula Fuller on 11/19/19, at which time Paula Fuller reported that Paula Fuller mood was better.  Prozac was helping, Paula Fuller was on 10 mg strength.  BuSpar was helping for situational anxiety and Paula Fuller was taking it infrequently.  Paula Fuller was on Sinemet 4 times a day.  Paula Fuller reported that Paula Fuller  was having a harder time getting through the night with Paula Fuller motor symptoms flaring up in the middle of the night.  Paula Fuller was advised to continue with all Paula Fuller medications but add Sinemet CR at bedtime.  We talked about adding Comtan next.  We also talked about the possibility of just moving Paula Fuller Sinemet IR closer together to add a 5th dose to Paula Fuller regimen.  Paula Fuller emailed recently in September 2021 reporting that Paula Fuller had stopped most of Paula Fuller medications for concern for side effects. Paula Fuller reported having  more dream enactment behavior as witnessed by Paula Fuller husband.  Paula Fuller thought that these became worse in the previous 3 months and therefore tapered herself off of most of Paula Fuller meds. Paula Fuller had stopped the Sinemet CR, Prozac, clonazepam.  Paula Fuller was advised to start back on the Sinemet CR and clonazepam and continue with Sinemet IR.   I saw Paula Fuller on 08/21/2019, at which time Paula Fuller reported significant increase in Paula Fuller stress.  Paula Fuller was unable to tolerate Celexa in the recent past and had also tried Lexapro.  In the distant past Paula Fuller had tried Prozac and we mutually agreed to try low-dose Prozac and for anxiety I suggested BuSpar and low-dose with gradual increase, as needed.  Paula Fuller was advised to continue with Paula Fuller Sinemet.  Paula Fuller declined a referral to psychiatry.  Paula Fuller also declined a referral for a second opinion for Paula Fuller Parkinson's disease.      I saw Paula Fuller on 05/21/2019, at which time Paula Fuller felt improvement with the Sinemet.  Paula Fuller was tolerating it okay.  Paula Fuller had improvement in Paula Fuller headaches after Paula Fuller stopped the Neupro patch.  Paula Fuller had significant issues with anxiety and also depression.  Paula Fuller had weight gain in the past with sertraline.  I suggested a trial of Celexa.  I also suggested Paula Fuller increase the Sinemet to 4 times a day.     Paula Fuller emailed in the interim in early December 2020 indicating that the Sinemet 4 times a day felt too much, Paula Fuller felt dizzy.  Paula Fuller was advised to reduce it back to 3 times daily.  Paula Fuller also reported side effects from the  Celexa including nausea and vomiting and appetite loss and was therefore advised to stop it.         I saw Paula Fuller on 02/17/2019, at which time Paula Fuller reported no significant results from the Neupro, had ongoing issues with weight gain and swelling and also felt that Paula Fuller had developed some headaches after starting the Neupro patch.  Paula Fuller reported dream enactment behaviors.  I suggested Paula Fuller stop the Neupro and start a trial of Sinemet.  In addition, Paula Fuller was advised to start clonazepam at night.  Paula Fuller called in the interim reporting that Paula Fuller PCP did not want Paula Fuller on both Xanax and clonazepam and we have discussed this as well.  Paula Fuller was advised to discuss this matter with Paula Fuller primary care physician and see if Paula Fuller could go without the Xanax during the day and continue with the clonazepam at night.    I saw Paula Fuller on 11/21/2018 in a virtual visit, at which time Paula Fuller reported side effects with Mirapex including weight gain and not sleeping well at night.  I suggested we switch Paula Fuller over to the Neupro patch, 2 mg strength.   I saw Paula Fuller on 06/26/2018, At which time Paula Fuller reported feeling better, Paula Fuller reported that the Mirapex was helpful.  Paula Fuller had stopped a new antidepressant and started generic Celexa.  Paula Fuller anxiety was better.  Paula Fuller was able to tolerate the Mirapex.   I saw Paula Fuller on 03/27/2018, at which time Paula Fuller reported still struggling with Paula Fuller anxiety and depressive symptoms. We talked about Paula Fuller DaT scan results. Paula Fuller had tried multiple different antidepressants in the past, Paula Fuller was given most recently a sample for Trintellix by Paula Fuller PCP but had not started it. Paula Fuller was advised to start it.   Paula Fuller was also advised to start Mirapex with gradual increase. Paula Fuller called in the interim in November 2019 reporting that Mirapex was not making a  big difference. Paula Fuller was advised that Paula Fuller was on a low-dose of advised to increase it. Paula Fuller had also called in September 2019 reporting that Paula Fuller was not able to tolerate the antidepressant that was given to  Paula Fuller by Paula Fuller PCP. Paula Fuller was advised to talk to Paula Fuller family doctor about alternative treatment options for anxiety and depression. Paula Fuller had reduced Paula Fuller Xanax.     I first met Paula Fuller on 12/13/2017 at the request of Paula Fuller primary care provider, at which time Paula Fuller reported a bilateral hand tremor of at least 2 years duration as well as fine motor dyscontrol. Right-sided symptoms were worse on the left. Paula Fuller examination was concerning for parkinsonism with right-sided lateralization. I suggested we proceed with a brain MRI and we subsequently also proceeded with a DaT scan.  Paula Fuller had a brain MRI with and without contrast on 12/27/2017 and I reviewed the results: IMPRESSION: This MRI of the brain with and without contrast shows the following: 1.    There are some scattered T2/FLAIR hyperintense foci consistent with mild age-related chronic microvascular ischemic changes 2.    There are no acute findings and there is a normal enhancement pattern.   We called Paula Fuller with Paula Fuller test results.   Paula Fuller had a nuclear medicine DaT scan on 03/21/2018 and a very tight results:  IMPRESSION: Near absent radiotracer activity within the bilateral posterior striata (putamen) in a pattern typical of a Parkinson's syndrome. pathology. There is mild decreased relative activity in the head of the LEFT caudate nucleus compared to the RIGHT.   We called Paula Fuller with Paula Fuller test results and scheduled Paula Fuller for a sooner than scheduled appointment.   12/13/2017: (Paula Fuller) reports a right more than left hand tremor for the past 2 years, worse in the past few months. Paula Fuller has noticed other issues including fine motor dyscontrol, balance issues, mild short-term memory loss. 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 Paula Fuller has never considered seeing ENT for this. Paula Fuller has suboptimally controlled anxiety. Paula Fuller has been on Lexapro in the past which caused significant GI related side effects and for the past 2 months Paula Fuller has been on sertraline. Paula Fuller does  not feel that the sertraline has been helpful. Paula Fuller has been on Xanax off and on since the late 90s when Paula Fuller had to take care of Paula Fuller father. Paula Fuller has been on Xanax consistently 3 times a day for the past couple of years, since 2016 or so. Paula Fuller is currently on sertraline 100 mg daily and alprazolam 0.5 mg 3 times a day. Paula Fuller has had significant weight loss and had multiple tests for this including endoscopy and CAT scan all per Paula Fuller report without significant abnormality is thankfully. Paula Fuller is using some protein milkshakes to supplement Paula Fuller diet. Paula Fuller tremor is at rest as well as with activity at times. It is more noticeable on the right, Paula Fuller has also noticed difficulty with fine motor tasks also more on the right. Paula Fuller fell recently once in the yard, as Paula Fuller was standing up from the squatting position and thankfully did not hurt herself.I reviewed your office note from 10/29/2017. Paula Fuller has no family history of tremors but reports a family history of Parkinson's disease, Paula Fuller father's first cousin apparently had Parkinson's disease, another first cousin of Paula Fuller father had some other neurological disease, maybe MS. Paula Fuller has worried about MS. Paula Fuller saw a neurologist in [place], [place] in August 2018 told Paula Fuller Paula Fuller does not have MS. Paula Fuller is a nonsmoker  and does not drink alcohol and drinks caffeine one serving per day on average. Paula Fuller is married and lives with Paula Fuller husband, they have 1 daughter. 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  Paula Fuller Past Medical History Is Significant For: Past Medical History:  Diagnosis Date   Anxiety disorder    GERD (gastroesophageal reflux disease)    High cholesterol    IBS (irritable bowel syndrome)    Insomnia     Paula Fuller Past Surgical History Is Significant For: Past Surgical History:  Procedure Laterality Date   CATARACT EXTRACTION Right    CATARACT EXTRACTION Left 08/02/2022   CHOLECYSTECTOMY      Paula Fuller Family History Is  Significant For: Family History  Problem Relation Age of Onset   Parkinson's disease Paternal Uncle    Parkinson's disease Paternal Uncle    Tremor Neg Hx     Paula Fuller Social History Is Significant For: Social History   Socioeconomic History   Marital status: Married    Spouse name: Not on file   Number of children: Not on file   Years of education: Not on file   Highest education level: Not on file  Occupational History   Not on file  Tobacco Use   Smoking status: Never   Smokeless tobacco: Never  Vaping Use   Vaping Use: Never used  Substance and Sexual Activity   Alcohol use: Never   Drug use: Never   Sexual activity: Not on file  Other Topics Concern   Not on file  Social History Narrative   Right handed   Social Determinants of Health   Financial Resource Strain: Not on file  Food Insecurity: Not on file  Transportation Needs: Not on file  Physical Activity: Not on file  Stress: Not on file  Social Connections: Not on file    Paula Fuller Allergies Are:  Allergies  Allergen Reactions   Other     "All anti-anxiety medications we've tried" have upset my stomach (wellbutrin, prozac, xanax)   Prozac [Fluoxetine] Other (See Comments)    Upset Stomach  :   Paula Fuller Current Medications Are:  Outpatient Encounter Medications as of 01/23/2023  Medication Sig   busPIRone (BUSPAR) 10 MG tablet TAKE 1 PILL IN THE MORNING AND 2 PILLS IN THE AFTERNOON AS NEEDED FOR ANXIETY   carbidopa-levodopa (SINEMET IR) 25-100 MG tablet Take 1 tablet by mouth 5 (five) times daily. Take at 7, 10, 1 PM, 4 PM, and 7 PM.   clonazePAM (KLONOPIN) 1 MG disintegrating tablet Take 2 tablets (2 mg total) by mouth at bedtime.   cyclobenzaprine (FLEXERIL) 10 MG tablet Take 10 mg by mouth as needed for muscle spasms.   ibuprofen (ADVIL) 800 MG tablet Take 800 mg by mouth as needed (joint pain).   Multiple Vitamin (MULTIVITAMIN PO) Take by mouth.   VITAMIN D PO Take by mouth.   FLUoxetine (PROZAC) 20 MG capsule  Take 1 capsule (20 mg total) by mouth daily.   [DISCONTINUED] MELATONIN PO Take by mouth.   No facility-administered encounter medications on file as of 01/23/2023.  :  Review of Systems:  Out of a complete 14 point review of systems, all are reviewed and negative with the exception of these symptoms as listed below:  Review of Systems  Neurological:        Patient is here alone for 6 month Parkinson's follow-up. Paula Fuller states Paula Fuller symptoms are about the same but Paula Fuller  biggest problem right now is the lack of sleep. Paula Fuller doesn't feel rested when Paula Fuller wakes up if Paula Fuller sleeps. Once Paula Fuller wakes up Paula Fuller cannot go back to sleep. Klonopin doesn't work well. Paula Fuller still thrashes around in sleep and talks in Paula Fuller sleep. Paula Fuller wakes up with headaches and they last all day. They have been much more frequent. Cyclobenzaprine doesn't work but instead gives Paula Fuller headaches. Ibuprofen helps ease the pain from thrashing all night.    Objective:  Neurological Exam  Physical Exam Physical Examination:   Vitals:   01/23/23 0742  BP: 131/84  Pulse: 83    General Examination: The patient is a very pleasant 72 y.o. female in no acute distress. Paula Fuller appears well-developed and well-nourished and well groomed.   HEENT: Normocephalic, atraumatic, pupils are equal, round and reactive to light, has corrective eyeglasses.  Extraocular tracking is fairly well-preserved, moderate facial masking is noted. No significant lip or jaw tremor is noted today. Airway examination is stable, moderate airway crowding with tonsillar size of 1-2+, small airway entry and slightly wider uvula.  Neck circumference 14 inches. Paula Fuller has minimal hypophonia, no dysarthria, mild nuchal rigidity, which is stable.  No new issues, good tongue movements.  No carotid bruits.   Chest: Clear to auscultation without wheezing, rhonchi or crackles noted.   Heart: S1+S2+0, regular and normal without murmurs, rubs or gallops noted.    Abdomen: Soft, non-tender and  non-distended.   Extremities: There is no pitting edema in the distal lower extremities bilaterally.    Skin: Warm and dry without trophic changes noted.    Musculoskeletal: exam reveals no obvious joint deformities.    Neurologically:  Mental status: The patient is awake, alert and oriented in all 4 spheres. Paula Fuller immediate and remote memory, attention, language skills and fund of knowledge are good. Mood and affect normal.  Good spirits.   Cranial nerves II - XII are as described above under HEENT exam. Motor exam: Normal bulk, and strength are noted. Paula Fuller has mild increase in tone in the right more than left upper extremity. There is a very mild intermittent resting tremor in the right hand. Overall mild bradykinesia, stable.   (On 12/13/2017: On Archimedes spiral drawing Paula Fuller has slight insecurity with both hands, no obvious tremor though. Handwriting is legible, not particularly tremulous, but mildly micrographic.)    Paula Fuller has a slight postural tremor in both upper extremities, Paula Fuller has no significant action tremor, no intention tremor. On fine motor testing, Paula Fuller has mild to moderate difficulty on the right, and better on the left.   Cerebellar testing: No dysmetria or intention tremor. There is no truncal or gait ataxia. Sensory exam: intact to light touch.  Gait, station and balance: Paula Fuller stands without difficulty, posture is mildly stooped for age, slight tilt to the R. Paula Fuller walks with a slightly decreased stride length and good pace, decreased arm swing on the right more than left.  Paula Fuller turns quite well, balance is preserved.    Assessment and Plan:  In summary, Anida Deol is a very pleasant 72 year old female ith an underlying medical history of anxiety, depression, irritable bowel syndrome, weight loss, prior history of hyperlipidemia and reflux disease, who presents for follow-up consultation of Paula Fuller right-sided predominant Parkinson's disease, complicated by sleep disturbance including  dream enactment behavior, anxiety, and weight fluctuations, with motor symptoms dating back to about 4-5 years ago.  Paula Fuller DaT scan in 09/19 supported the diagnosis. Paula Fuller brain MRI in 06/19 showed age-appropriate findings. Paula Fuller  started treatment with Mirapex in 2019, low-dose and initially did quite well, noticed an improvement in Paula Fuller tremor and Paula Fuller exam had improved.  However, Paula Fuller was having more trouble with taking 0.25 mg strength 2 pills 3 times daily. Paula Fuller also gained weight while on it.  Paula Fuller was advised to discontinue Mirapex and start Neupro patch in May 2020.  Paula Fuller did not have telltale results from the Neupro, had ongoing issues with swelling and weight gain, also headaches. We stopped the Neupro patch and Paula Fuller started Sinemet in August 2020. We increased this to 1 pill 5 times a day in October 2021.  Paula Fuller has had some dizziness from it, but overall tolerates it and has done well with it.  Paula Fuller has been on clonazepam 0.5 mg each night since October 2020. Paula Fuller had been on Xanax before but no longer takes it.  Paula Fuller has struggled with anxiety and depression for years.  Paula Fuller has been on several different medications, could not tolerate Celexa which we tried, and has been on low dose Prozac since February 2021, but tapered off of it due to GI side effects. Paula Fuller started having increased dream enactment behavior.  Paula Fuller tried melatonin for this but it did not help and Paula Fuller had headaches from the melatonin.  For increased anxiety, we increased BuSpar to 5 mg 3 times daily in October 2021.  Paula Fuller has had some residual evening anxiety, particularly prior to bedtime.  Paula Fuller was encouraged to increase the BuSpar to 10 mg at bedtime in March 2022. We restarted Prozac at 20 mg strength in 09/22. Paula Fuller started Flexeril 10 mg at bedtime as needed in September 2022 for muscle cramps due to restlessness but no longer has to take it.  Paula Fuller also stopped taking the BuSpar sometime earlier in 2023 due to less stress and anxiety.  Paula Fuller had some nausea  and even vomiting from BuSpar in the afternoon but has it still at home for as needed use and would like to have it as a backup for anxiety flare to be used once daily.  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 We increased Paula Fuller clonazepam to 1 mg at bedtime in October 2021 and Paula Fuller increase this further to 2 mg at bedtime on Paula Fuller own in or around May or June 2023.  Sometimes Paula Fuller tries to take just the 1 mg at night.  We talked about precautions with high risk medications such as benzodiazepine medication especially once we get to a certain age.  Paula Fuller is mindful of this, Paula Fuller has had no withdrawal symptoms and is reassured that Paula Fuller is currently still on a safe dose and can take 1 mg to up to 2 mg at night for sleep, particularly Paula Fuller dream enactment behavior. We started the Sinemet CR at bedtime in May 2021 but Paula Fuller stopped it in September 2023, Paula Fuller felt that it made Paula Fuller more hyper.    Today I suggested the following:  We will maintain Sinemet IR 1 pill 5 times a day.  We can consider going to 1-1/2 pills 4 times a day in the near future to cut back on the dose times but for now we mutually agreed to keep it going.   For Paula Fuller sleep disturbance, it may be from the BuSpar and therefore Paula Fuller is advised to taper off of it.  Paula Fuller is advised to take 1 pill twice daily for 3 days, then 1 pill once daily in the morning  for 3 days, then stop.  We may consider low-dose trazodone in the near future.  For anxiety, especially since Paula Fuller has also stopped Paula Fuller Prozac, we will restart Paula Fuller on Wellbutrin.  Paula Fuller had tried it in the past and did not have side effects but it stopped being effective for Paula Fuller at 1 point as Paula Fuller recalls.  Paula Fuller will start Wellbutrin XL generic 150 mg once daily.  Paula Fuller is worried about weight gain and Paula Fuller is advised that if anything it is weight neutral or can contribute to some weight loss which is not unwell, in most cases.   We will go ahead and proceed with  a home sleep test.  Paula Fuller endorses snoring, restless sleep, morning headaches and daytime tiredness.  If Paula Fuller has obstructive sleep apnea I will likely recommend AutoPap therapy for Paula Fuller.  Paula Fuller has had a little bit of weight gain as well.    I have discontinued Paula Fuller BuSpar, Paula Fuller will maintain clonazepam at 2 mg at bedtime, I have placed a new prescription for Wellbutrin and renewed Paula Fuller Sinemet at the current dose and times.    Will plan to follow-up in 4 months, sooner if needed.  We will call Paula Fuller with Paula Fuller home sleep test results in the interim.    I answered all Paula Fuller questions today and Paula Fuller was in agreement.    I spent 40 minutes in total face-to-face time and in reviewing records during pre-charting, more than 50% of which was spent in counseling and coordination of care, reviewing test results, reviewing medications and treatment regimen and/or in discussing or reviewing the diagnosis of PD, anxiety, sleep disturbance, the prognosis and treatment options. Pertinent laboratory and imaging test results that were available during this visit with the patient were reviewed by me and considered in my medical decision making (see chart for details).

## 2023-01-23 NOTE — Telephone Encounter (Signed)
"  take 1 pill twice daily for 3 days, then 1 pill once daily in the morning for 3 days, then stop. We may consider low-dose trazodone in the near future." From Dr Teofilo Pod office visit. Spoke with Dr Frances Furbish. Patient can go ahead and start Wellbutrin now.

## 2023-02-12 ENCOUNTER — Ambulatory Visit: Payer: Medicare PPO | Admitting: Neurology

## 2023-02-14 ENCOUNTER — Other Ambulatory Visit: Payer: Self-pay | Admitting: Neurology

## 2023-03-13 ENCOUNTER — Other Ambulatory Visit: Payer: Self-pay | Admitting: Neurology

## 2023-06-26 ENCOUNTER — Ambulatory Visit: Payer: Medicare PPO | Admitting: Neurology

## 2023-06-26 ENCOUNTER — Encounter: Payer: Self-pay | Admitting: Neurology

## 2023-06-26 ENCOUNTER — Ambulatory Visit: Payer: Medicare PPO | Admitting: Adult Health

## 2023-06-26 VITALS — BP 112/70 | HR 68 | Ht 62.0 in | Wt 148.0 lb

## 2023-06-26 DIAGNOSIS — G4752 REM sleep behavior disorder: Secondary | ICD-10-CM | POA: Diagnosis not present

## 2023-06-26 DIAGNOSIS — G20A2 Parkinson's disease without dyskinesia, with fluctuations: Secondary | ICD-10-CM

## 2023-06-26 DIAGNOSIS — M62838 Other muscle spasm: Secondary | ICD-10-CM

## 2023-06-26 DIAGNOSIS — F419 Anxiety disorder, unspecified: Secondary | ICD-10-CM

## 2023-06-26 DIAGNOSIS — G479 Sleep disorder, unspecified: Secondary | ICD-10-CM | POA: Diagnosis not present

## 2023-06-26 MED ORDER — CARBIDOPA-LEVODOPA 25-100 MG PO TABS: 1.0000 | ORAL_TABLET | Freq: Every day | ORAL | 3 refills | Status: DC

## 2023-06-26 MED ORDER — CYCLOBENZAPRINE HCL 10 MG PO TABS: 10.0000 mg | ORAL_TABLET | Freq: Every evening | ORAL | 1 refills | Status: DC | PRN

## 2023-06-26 NOTE — Patient Instructions (Signed)
It was nice to see you again today.  We will maintain your Sinemet 1 pill 5 times a day as previously scheduled.  I will renew your prescription for Flexeril as needed at bedtime for muscle spasms and leg spasms.  Please try to exercise regularly in the form of walking or stationary bike use.  Follow-up in about 6 to 8 months to see one of our nurse practitioners in this office.

## 2023-06-26 NOTE — Progress Notes (Signed)
Subjective:    Patient ID: Paula Fuller is a 72 y.o. female.  HPI    Interim history:   Paula Fuller is a 72 year old right-handed woman with an underlying medical history of anxiety, depression, irritable bowel syndrome, history of hyperlipidemia and reflux disease, who presents for follow-up consultation of Paula Fuller right sided parkinsonism, complicated by anxiety, sleep disturbance including dream enactment behavior, weight fluctuation and GI symptoms. The patient is unaccompanied today. I last saw Paula Fuller on 01/23/2023, at which time Paula Fuller was not sleeping well.  Paula Fuller endorsed restlessness and tossing and turning.  Paula Fuller was having a dream enactment behavior.  Paula Fuller tried melatonin but Paula Fuller had a headache from it.  Paula Fuller was on BuSpar for anxiety.  Paula Fuller had residual anxiety and I suggested we start Paula Fuller on Wellbutrin and stop taking BuSpar gradually.  We considered trazodone for future use and I considered a home sleep test.    Today, 06/26/2023: Paula Fuller reports feeling fairly stable.  We had talked about doing a home sleep test last time but Paula Fuller does not feel the need for it now.  Paula Fuller is sleeping fairly well.  Paula Fuller does have some sleep talking to Paula Fuller husband's feedback but does not have any dramatic movements or dream enactment behaviors currently.  Paula Fuller feels that that has actually improved.  Paula Fuller is no longer on Wellbutrin, Paula Fuller felt like Paula Fuller was foggy headed, and, Paula Fuller stopped it about 2 months ago without repercussions, actually feels better without it.  Paula Fuller no longer takes clonazepam or Prozac either.  Sometimes Paula Fuller anxiety flares up but Paula Fuller is trying to make do with very little medications as Paula Fuller does not tolerate medicines very well.  Paula Fuller had Paula Fuller knee about 2 to 3 months ago and had mildly elevated cholesterol numbers but Paula Fuller "good" cholesterol was fairly high and no medication was suggested at the time.  Has had Paula Fuller balance is not as good.  Especially when Paula Fuller has been for a while and has to suddenly get up Paula Fuller may feel  lightheaded and wobbly.  Paula Fuller has not fallen thankfully.  Constipation is a problem.  Paula Fuller is trying to be active, is taking care of Paula Fuller household and works as a Lawyer about 3 times a month.  Nevertheless, Paula Fuller does not exercise in any regular pattern and feels that Paula Fuller has gained some weight.  Paula Fuller tremor fluctuates.  Paula Fuller takes Paula Fuller Sinemet regularly 5 times a day and it does last Paula Fuller about 3 hours, Paula Fuller can tell when Paula Fuller is due for Paula Fuller next dose typically.  Paula Fuller starts Paula Fuller first dose at 7 AM.  The patient's allergies, current medications, family history, past medical history, past social history, past surgical history and problem list were reviewed and updated as appropriate.    Previously:  I saw Paula Fuller on 08/16/2022, at which time Paula Fuller reported doing quite well, Paula Fuller felt stable.  Paula Fuller had stopped taking the Sinemet CR as Paula Fuller had nervousness from it.  Paula Fuller was taking Sinemet IR 5 times a day and Prozac in the morning, clonazepam at bedtime.  Paula Fuller was utilizing the BuSpar for anxiety as needed.  Paula Fuller had recent right eye cataract surgery and was supposed to have the left eye done in mid February 2024.      I saw Paula Fuller on 01/31/2022, at which time Paula Fuller reported sleeping better and Paula Fuller reported doing better.  Paula Fuller was taking clonazepam 2 mg at bedtime which was helping Paula Fuller vivid dreams and dream enactment.  Paula Fuller felt that  Paula Fuller did not need the BuSpar any longer.  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  Paula Fuller was offered a full-time position but declined at.  Paula Fuller was tolerating low-dose Prozac.  Paula Fuller was on Sinemet CR at bedtime and Sinemet IR 5 times a day at 3-hour intervals.  Paula Fuller was taking a probiotic daily.  Paula Fuller was advised to continue with Paula Fuller medication regimen.     I saw Paula Fuller on 08/02/2021, at which time Paula Fuller reported doing well overall but still had some difficulty maintaining sleep and intermittent issues with sleep talking and dream enactment behavior per  husband's feedback.  Mood wise Paula Fuller felt a lot better compared to few months prior.  I suggested adding a small dose of clonazepam 0.25 mg in the middle of the night, preferably no later than 3:30 AM.  Paula Fuller was also advised to add half of a dose if Paula Fuller were to wake up more closer to 4 AM.  Paula Fuller was advised to continue with Paula Fuller clonazepam 1 mg at bedtime, Sinemet CR at bedtime, and Sinemet IR 1 pill 5 times a day.  Paula Fuller was advised to continue with Flexeril 10 mg at bedtime as needed, continue with generic Prozac 20 mg daily and BuSpar 10 mg in the morning and 20 mg in the afternoon.     I saw Paula Fuller on 03/23/21, at which time Paula Fuller reported doing fairly well motor wise.  Paula Fuller had residual anxiety.  We mutually agreed to increase Paula Fuller BuSpar.  We also restarted Paula Fuller Prozac.  Paula Fuller was advised to continue with Sinemet and Sinemet CR.  Paula Fuller was using melatonin at night.  Paula Fuller had occasional neck stiffness and shoulder girdle stiffness and had tried Flexeril 10 mg strength from Paula Fuller husband.  I did prescribe Flexeril for Paula Fuller for as needed use.  Paula Fuller was advised not to use Paula Fuller husband's medication though.  Paula Fuller was active physically.     I saw Paula Fuller on 09/20/20, at which time Paula Fuller reported feeling better.  Paula Fuller had been more consistent with Paula Fuller medications.  Paula Fuller did have residual anxiety especially prior to bedtime.  Paula Fuller was on BuSpar 5 mg 3 times daily consistently.  Paula Fuller was on clonazepam at night and melatonin 5 mg at night.  Paula Fuller was encouraged to try to increase the melatonin to up to 10 mg at night.  Paula Fuller was also advised to increase Paula Fuller evening BuSpar dose to 10 mg and keep the morning and midday doses at 5 mg.  Paula Fuller was advised to continue with the Sinemet CR at night and Sinemet regular release 1 pill 5 times a day.  Paula Fuller found that the increase to the 5 times a day schedule for Paula Fuller IR Sinemet was helpful.     I saw Paula Fuller on 05/07/20, at which time Paula Fuller reported that Paula Fuller had worsening dream enactment behavior.  Of note, Paula Fuller tapered  herself off Paula Fuller medications but had restarted the clonazepam and the Sinemet CR.  Paula Fuller was advised to be consistent with Paula Fuller medication, Paula Fuller was advised to start BuSpar, Paula Fuller was off of Prozac and Paula Fuller was advised to be consistent with clonazepam.  We increased Paula Fuller Sinemet IR to 1 pill 5 times a day at the time.     I saw Paula Fuller on 11/19/19, at which time Paula Fuller reported that Paula Fuller mood was better.  Prozac was helping, Paula Fuller was on 10 mg strength.  BuSpar was helping for situational anxiety and Paula Fuller was taking  it infrequently.  Paula Fuller was on Sinemet 4 times a day.  Paula Fuller reported that Paula Fuller was having a harder time getting through the night with Paula Fuller motor symptoms flaring up in the middle of the night.  Paula Fuller was advised to continue with all Paula Fuller medications but add Sinemet CR at bedtime.  We talked about adding Comtan next.  We also talked about the possibility of just moving Paula Fuller Sinemet IR closer together to add a 5th dose to Paula Fuller regimen.  Paula Fuller emailed recently in September 2021 reporting that Paula Fuller had stopped most of Paula Fuller medications for concern for side effects. Paula Fuller reported having more dream enactment behavior as witnessed by Paula Fuller husband.  Paula Fuller thought that these became worse in the previous 3 months and therefore tapered herself off of most of Paula Fuller meds. Paula Fuller had stopped the Sinemet CR, Prozac, clonazepam.  Paula Fuller was advised to start back on the Sinemet CR and clonazepam and continue with Sinemet IR.   I saw Paula Fuller on 08/21/2019, at which time Paula Fuller reported significant increase in Paula Fuller stress.  Paula Fuller was unable to tolerate Celexa in the recent past and had also tried Lexapro.  In the distant past Paula Fuller had tried Prozac and we mutually agreed to try low-dose Prozac and for anxiety I suggested BuSpar and low-dose with gradual increase, as needed.  Paula Fuller was advised to continue with Paula Fuller Sinemet.  Paula Fuller declined a referral to psychiatry.  Paula Fuller also declined a referral for a second opinion for Paula Fuller Parkinson's disease.      I saw Paula Fuller on 05/21/2019, at which  time Paula Fuller felt improvement with the Sinemet.  Paula Fuller was tolerating it okay.  Paula Fuller had improvement in Paula Fuller headaches after Paula Fuller stopped the Neupro patch.  Paula Fuller had significant issues with anxiety and also depression.  Paula Fuller had weight gain in the past with sertraline.  I suggested a trial of Celexa.  I also suggested Paula Fuller increase the Sinemet to 4 times a day.     Paula Fuller emailed in the interim in early December 2020 indicating that the Sinemet 4 times a day felt too much, Paula Fuller felt dizzy.  Paula Fuller was advised to reduce it back to 3 times daily.  Paula Fuller also reported side effects from the Celexa including nausea and vomiting and appetite loss and was therefore advised to stop it.         I saw Paula Fuller on 02/17/2019, at which time Paula Fuller reported no significant results from the Neupro, had ongoing issues with weight gain and swelling and also felt that Paula Fuller had developed some headaches after starting the Neupro patch.  Paula Fuller reported dream enactment behaviors.  I suggested Paula Fuller stop the Neupro and start a trial of Sinemet.  In addition, Paula Fuller was advised to start clonazepam at night.  Paula Fuller called in the interim reporting that Paula Fuller PCP did not want Paula Fuller on both Xanax and clonazepam and we have discussed this as well.  Paula Fuller was advised to discuss this matter with Paula Fuller primary care physician and see if Paula Fuller could go without the Xanax during the day and continue with the clonazepam at night.    I saw Paula Fuller on 11/21/2018 in a virtual visit, at which time Paula Fuller reported side effects with Mirapex including weight gain and not sleeping well at night.  I suggested we switch Paula Fuller over to the Neupro patch, 2 mg strength.   I saw Paula Fuller on 06/26/2018, At which time Paula Fuller reported feeling better, Paula Fuller reported that the Mirapex was helpful.  Paula Fuller had  stopped a new antidepressant and started generic Celexa.  Paula Fuller anxiety was better.  Paula Fuller was able to tolerate the Mirapex.   I saw Paula Fuller on 03/27/2018, at which time Paula Fuller reported still struggling with Paula Fuller anxiety and depressive  symptoms. We talked about Paula Fuller DaT scan results. Paula Fuller had tried multiple different antidepressants in the past, Paula Fuller was given most recently a sample for Trintellix by Paula Fuller PCP but had not started it. Paula Fuller was advised to start it.   Paula Fuller was also advised to start Mirapex with gradual increase. Paula Fuller called in the interim in November 2019 reporting that Mirapex was not making a big difference. Paula Fuller was advised that Paula Fuller was on a low-dose of advised to increase it. Paula Fuller had also called in September 2019 reporting that Paula Fuller was not able to tolerate the antidepressant that was given to Paula Fuller by Paula Fuller PCP. Paula Fuller was advised to talk to Paula Fuller family doctor about alternative treatment options for anxiety and depression. Paula Fuller had reduced Paula Fuller Xanax.     I first met Paula Fuller on 12/13/2017 at the request of Paula Fuller primary care provider, at which time Paula Fuller reported a bilateral hand tremor of at least 2 years duration as well as fine motor dyscontrol. Right-sided symptoms were worse on the left. Paula Fuller examination was concerning for parkinsonism with right-sided lateralization. I suggested we proceed with a brain MRI and we subsequently also proceeded with a DaT scan.  Paula Fuller had a brain MRI with and without contrast on 12/27/2017 and I reviewed the results: IMPRESSION: This MRI of the brain with and without contrast shows the following: 1.    There are some scattered T2/FLAIR hyperintense foci consistent with mild age-related chronic microvascular ischemic changes 2.    There are no acute findings and there is a normal enhancement pattern.   We called Paula Fuller with Paula Fuller test results.    Paula Fuller had a nuclear medicine DaT scan on 03/21/2018 and a very tight results:  IMPRESSION: Near absent radiotracer activity within the bilateral posterior striata (putamen) in a pattern typical of a Parkinson's syndrome. pathology. There is mild decreased relative activity in the head of the LEFT caudate nucleus compared to the RIGHT.   We called Paula Fuller with Paula Fuller test  results and scheduled Paula Fuller for a sooner than scheduled appointment.   12/13/2017: (Paula Fuller) reports a right more than left hand tremor for the past 2 years, worse in the past few months. Paula Fuller has noticed other issues including fine motor dyscontrol, balance issues, mild short-term memory loss. 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 Paula Fuller has never considered seeing ENT for this. Paula Fuller has suboptimally controlled anxiety. Paula Fuller has been on Lexapro in the past which caused significant GI related side effects and for the past 2 months Paula Fuller has been on sertraline. Paula Fuller does not feel that the sertraline has been helpful. Paula Fuller has been on Xanax off and on since the late 90s when Paula Fuller had to take care of Paula Fuller father. Paula Fuller has been on Xanax consistently 3 times a day for the past couple of years, since 2016 or so. Paula Fuller is currently on sertraline 100 mg daily and alprazolam 0.5 mg 3 times a day. Paula Fuller has had significant weight loss and had multiple tests for this including endoscopy and CAT scan all per Paula Fuller report without significant abnormality is thankfully. Paula Fuller is using some protein milkshakes to supplement Paula Fuller diet. Paula Fuller tremor is at rest as well as with activity at times. It is more  noticeable on the right, Paula Fuller has also noticed difficulty with fine motor tasks also more on the right. Paula Fuller fell recently once in the yard, as Paula Fuller was standing up from the squatting position and thankfully did not hurt herself.I reviewed your office note from 10/29/2017. Paula Fuller has no family history of tremors but reports a family history of Parkinson's disease, Paula Fuller father's first cousin apparently had Parkinson's disease, another first cousin of Paula Fuller father had some other neurological disease, maybe MS. Paula Fuller has worried about MS. Paula Fuller saw a neurologist in [place], [place] in August 2018 told Paula Fuller Paula Fuller does not have MS. Paula Fuller is a nonsmoker and does not drink alcohol and drinks caffeine one serving per day on average. Paula Fuller is married and lives  with Paula Fuller husband, they have 1 daughter. 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  Paula Fuller Past Medical History Is Significant For: Past Medical History:  Diagnosis Date   Anxiety disorder    GERD (gastroesophageal reflux disease)    High cholesterol    IBS (irritable bowel syndrome)    Insomnia     Paula Fuller Past Surgical History Is Significant For: Past Surgical History:  Procedure Laterality Date   CATARACT EXTRACTION Right    CATARACT EXTRACTION Left 08/02/2022   CHOLECYSTECTOMY      Paula Fuller Family History Is Significant For: Family History  Problem Relation Age of Onset   Parkinson's disease Paternal Uncle    Parkinson's disease Paternal Uncle    Tremor Neg Hx     Paula Fuller Social History Is Significant For: Social History   Socioeconomic History   Marital status: Married    Spouse name: Not on file   Number of children: Not on file   Years of education: Not on file   Highest education level: Not on file  Occupational History   Not on file  Tobacco Use   Smoking status: Never   Smokeless tobacco: Never  Vaping Use   Vaping status: Never Used  Substance and Sexual Activity   Alcohol use: Never   Drug use: Never   Sexual activity: Not on file  Other Topics Concern   Not on file  Social History Narrative   Right handed   Social Determinants of Health   Financial Resource Strain: Not on file  Food Insecurity: Not on file  Transportation Needs: Not on file  Physical Activity: Not on file  Stress: Not on file  Social Connections: Not on file    Paula Fuller Allergies Are:  Allergies  Allergen Reactions   Other     "All anti-anxiety medications we've tried" have upset my stomach (wellbutrin, prozac, xanax)   Prozac [Fluoxetine] Other (See Comments)    Upset Stomach  :   Paula Fuller Current Medications Are:  Outpatient Encounter Medications as of 06/26/2023  Medication Sig   carbidopa-levodopa (SINEMET IR) 25-100 MG tablet  Take 1 tablet by mouth 5 (five) times daily. Take at 7, 10, 1 PM, 4 PM, and 7 PM.   cyclobenzaprine (FLEXERIL) 10 MG tablet Take 10 mg by mouth as needed for muscle spasms.   Multiple Vitamin (MULTIVITAMIN PO) Take by mouth.   VITAMIN D PO Take by mouth.   clonazePAM (KLONOPIN) 1 MG disintegrating tablet Take 2 tablets (2 mg total) by mouth at bedtime. (Patient not taking: Reported on 06/26/2023)   [DISCONTINUED] buPROPion (WELLBUTRIN XL) 150 MG 24 hr tablet Take 1 tablet (150 mg total) by mouth in the morning.   [  DISCONTINUED] FLUoxetine (PROZAC) 20 MG capsule Take 1 capsule (20 mg total) by mouth daily.   [DISCONTINUED] ibuprofen (ADVIL) 800 MG tablet Take 800 mg by mouth as needed (joint pain).   No facility-administered encounter medications on file as of 06/26/2023.  :  Review of Systems:  Out of a complete 14 point review of systems, all are reviewed and negative with the exception of these symptoms as listed below:  Review of Systems  Neurological:        Pt is here for follow up on Parkinson's disease. Pt states tremors are happening more often on both hands and now on both legs at times. Pt states Paula Fuller would like a refill on Paula Fuller Flexeril.     Objective:  Neurological Exam  Physical Exam Physical Examination:   Vitals:   06/26/23 0924  BP: 112/70  Pulse: 68    General Examination: The patient is a very pleasant 72 y.o. female in no acute distress. Paula Fuller appears well-developed and well-nourished and well groomed.  Good spirits, no significant anxiety.  HEENT: Normocephalic, atraumatic, pupils are equal, round and reactive to light, has corrective eyeglasses.  Extraocular tracking is fairly well-preserved, moderate facial masking is noted. No significant lip or jaw tremor is noted today. Airway examination is stable, moderate airway crowding and mild mouth dryness noted.  Tongue protrudes centrally and palate elevates symmetrically.  No carotid bruits.    Chest: Clear to  auscultation without wheezing, rhonchi or crackles noted.   Heart: S1+S2+0, regular and normal without murmurs, rubs or gallops noted.    Abdomen: Soft, non-tender and non-distended.   Extremities: There is no pitting edema in the distal lower extremities bilaterally.    Skin: Warm and dry without trophic changes noted.    Musculoskeletal: exam reveals no obvious joint deformities.    Neurologically:  Mental status: The patient is awake, alert and oriented in all 4 spheres. Paula Fuller immediate and remote memory, attention, language skills and fund of knowledge are good. Mood and affect normal.  Good spirits.   Cranial nerves II - XII are as described above under HEENT exam. Motor exam: Normal bulk, and strength are noted. Paula Fuller has mild increase in tone in the right more than left upper extremity. There is a very mild intermittent resting tremor in the right hand. Overall mild bradykinesia, all stable.   (On 12/13/2017: On Archimedes spiral drawing Paula Fuller has slight insecurity with both hands, no obvious tremor though. Handwriting is legible, not particularly tremulous, but mildly micrographic.)    Paula Fuller has a slight postural tremor in both upper extremities, Paula Fuller has no significant action tremor, no intention tremor. On fine motor testing, Paula Fuller has mild to moderate difficulty on the right, better on the left, stable overall. Cerebellar testing: No dysmetria or intention tremor. There is no truncal or gait ataxia. Sensory exam: intact to light touch.  Gait, station and balance: Paula Fuller stands without difficulty, posture is mildly stooped for age, slight tilt to the R. Paula Fuller walks with a slightly decreased stride length and good pace, decreased arm swing on the right more than left.  Paula Fuller turns quite well, but does have some insecurity with standing and turning especially after for standing up.  Paula Fuller feels mildly lightheaded.  No walking aid.    Assessment and Plan:  In summary, Chamara Malak is a 72 year old female  ith an underlying medical history of anxiety, depression, irritable bowel syndrome, overweight state, hyperlipidemia and reflux disease, who presents for follow-up consultation of Paula Fuller right-sided  predominant Parkinson's disease, complicated by sleep disturbance including dream enactment behavior, anxiety, and weight fluctuations, with motor symptoms dating back to about 5 years ago.    Paula Fuller DaT scan in 09/19 supported the diagnosis.  Paula Fuller brain MRI in 06/19 showed age-appropriate findings.  Paula Fuller started treatment with Mirapex in 2019, low-dose and initially did quite well, noticed an improvement in Paula Fuller tremor and Paula Fuller exam had improved.  However, Paula Fuller was having more trouble with taking 0.25 mg strength 2 pills 3 times daily. Paula Fuller also gained weight while on it.  Paula Fuller was advised to discontinue Mirapex and start Neupro patch in May 2020.  Paula Fuller did not have telltale results from the Neupro, had ongoing issues with swelling and weight gain, also headaches. We stopped the Neupro patch and Paula Fuller started Sinemet in August 2020. We increased this to 1 pill 5 times a day in October 2021.  Paula Fuller has had some dizziness from it, but overall tolerates it and has done well with it.  Paula Fuller has been on clonazepam 0.5 mg each night since October 2020. Paula Fuller had been on Xanax before but no longer takes it.  Paula Fuller has struggled with anxiety and depression for years.  Paula Fuller has been on several different medications, could not tolerate Celexa which we tried, and has been on low dose Prozac since February 2021, but tapered off of it due to GI side effects. Paula Fuller started having increased dream enactment behavior.  Paula Fuller tried melatonin for this but it did not help and Paula Fuller had headaches from the melatonin.  For increased anxiety, we increased BuSpar to 5 mg 3 times daily in October 2021.  Paula Fuller has had some residual evening anxiety, particularly prior to bedtime.  Paula Fuller was encouraged to increase the BuSpar to 10 mg at bedtime in March 2022. We restarted Prozac  at 20 mg strength in 09/22. Paula Fuller started Flexeril 10 mg at bedtime as needed in September 2022 for muscle cramps due to restlessness, but did not take it consistently.  Paula Fuller also stopped taking the BuSpar sometime earlier in 2023 due to less stress and anxiety.  Paula Fuller had some nausea and even vomiting from BuSpar in the afternoon but has it still at home for as needed use and would like to have it as a backup for anxiety flare to be used once daily.  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  We increased Paula Fuller clonazepam to 1 mg at bedtime in October 2021 and Paula Fuller increase this further to 2 mg at bedtime on Paula Fuller own in or around May or June 2023.  Paula Fuller is no longer on clonazepam.   We started the Sinemet CR at bedtime in May 2021 but Paula Fuller stopped it in September 2023, Paula Fuller felt that it made Paula Fuller more hyper.    Paula Fuller is currently on Sinemet IR 1 pill 5 times a day. We started Wellbutrin low-dose for anxiety in July 2024 but Paula Fuller stopped it in or around September 2024 due to side effects.  Today we mutually agreed to continue with Paula Fuller Sinemet IR 1 pill 5 times a day.    Paula Fuller can stay off BuSpar, Wellbutrin, Prozac and clonazepam.    I will renew Paula Fuller prescription for Flexeril as needed at bedtime.  For Paula Fuller sleep disturbance we had considered a home sleep test in July 2024 but Paula Fuller currently wants to hold off.  Paula Fuller feels that Paula Fuller is sleeping a little better.  Paula Fuller is advised  to follow-up routinely in this clinic in 6 to 8 months to see one of our nurse practitioners.     I answered all Paula Fuller questions today and Paula Fuller was in agreement.    I spent 30 minutes in total face-to-face time and in reviewing records during pre-charting, more than 50% of which was spent in counseling and coordination of care, reviewing test results, reviewing medications and treatment regimen and/or in discussing or reviewing the diagnosis of PD, the prognosis and treatment options. Pertinent  laboratory and imaging test results that were available during this visit with the patient were reviewed by me and considered in my medical decision making (see chart for details).

## 2023-08-03 ENCOUNTER — Other Ambulatory Visit: Payer: Self-pay | Admitting: Neurology

## 2023-08-03 DIAGNOSIS — M62838 Other muscle spasm: Secondary | ICD-10-CM

## 2023-08-03 DIAGNOSIS — G4752 REM sleep behavior disorder: Secondary | ICD-10-CM

## 2023-08-03 DIAGNOSIS — G20A2 Parkinson's disease without dyskinesia, with fluctuations: Secondary | ICD-10-CM

## 2023-08-03 DIAGNOSIS — F419 Anxiety disorder, unspecified: Secondary | ICD-10-CM

## 2023-08-03 DIAGNOSIS — G479 Sleep disorder, unspecified: Secondary | ICD-10-CM

## 2023-12-31 ENCOUNTER — Telehealth: Payer: Self-pay | Admitting: Neurology

## 2023-12-31 NOTE — Telephone Encounter (Signed)
 Appointment made For 6 mo f/u

## 2024-01-07 ENCOUNTER — Ambulatory Visit: Payer: Medicare PPO | Admitting: Family Medicine

## 2024-01-09 ENCOUNTER — Telehealth: Payer: Self-pay | Admitting: Neurology

## 2024-01-09 NOTE — Telephone Encounter (Signed)
 Pt requested appointment with Amy, NP be cx due to a schedule conflict with work.  Pt requested appointment with Dr Buck to discuss needed changes to her medications

## 2024-01-12 ENCOUNTER — Other Ambulatory Visit: Payer: Self-pay | Admitting: Neurology

## 2024-01-12 DIAGNOSIS — G4752 REM sleep behavior disorder: Secondary | ICD-10-CM

## 2024-01-12 DIAGNOSIS — G479 Sleep disorder, unspecified: Secondary | ICD-10-CM

## 2024-01-12 DIAGNOSIS — M62838 Other muscle spasm: Secondary | ICD-10-CM

## 2024-01-12 DIAGNOSIS — F419 Anxiety disorder, unspecified: Secondary | ICD-10-CM

## 2024-01-12 DIAGNOSIS — G20A2 Parkinson's disease without dyskinesia, with fluctuations: Secondary | ICD-10-CM

## 2024-03-11 ENCOUNTER — Ambulatory Visit: Admitting: Family Medicine

## 2024-04-01 ENCOUNTER — Encounter: Payer: Self-pay | Admitting: Neurology

## 2024-04-01 ENCOUNTER — Ambulatory Visit: Admitting: Neurology

## 2024-04-01 VITALS — BP 127/83 | HR 84 | Ht 62.0 in | Wt 147.0 lb

## 2024-04-01 DIAGNOSIS — F32A Depression, unspecified: Secondary | ICD-10-CM

## 2024-04-01 DIAGNOSIS — G479 Sleep disorder, unspecified: Secondary | ICD-10-CM

## 2024-04-01 DIAGNOSIS — F419 Anxiety disorder, unspecified: Secondary | ICD-10-CM | POA: Diagnosis not present

## 2024-04-01 DIAGNOSIS — G20A2 Parkinson's disease without dyskinesia, with fluctuations: Secondary | ICD-10-CM | POA: Diagnosis not present

## 2024-04-01 DIAGNOSIS — G4752 REM sleep behavior disorder: Secondary | ICD-10-CM | POA: Diagnosis not present

## 2024-04-01 DIAGNOSIS — G4719 Other hypersomnia: Secondary | ICD-10-CM

## 2024-04-01 DIAGNOSIS — M62838 Other muscle spasm: Secondary | ICD-10-CM

## 2024-04-01 MED ORDER — CYCLOBENZAPRINE HCL 10 MG PO TABS: 10.0000 mg | ORAL_TABLET | Freq: Every evening | ORAL | 2 refills | Status: DC | PRN

## 2024-04-01 MED ORDER — CLONAZEPAM 0.5 MG PO TBDP: 0.5000 mg | ORAL_TABLET | Freq: Every day | ORAL | 5 refills | Status: AC

## 2024-04-01 MED ORDER — CARBIDOPA-LEVODOPA 25-100 MG PO TABS: 1.0000 | ORAL_TABLET | Freq: Every day | ORAL | 3 refills | Status: AC

## 2024-04-01 NOTE — Patient Instructions (Signed)
 Start Melatonin: 10 mg each night at bedtime, may gradually go up to 15 mg or even 20 mg if needed.  We will restart you on Klonopin  (generic: clonazepam ): 0.5 mg, take one pill each bedtime. Common side effects include sedation or sleepiness, balance problem, rare: personality changes.  Increase your levodopa  to 1 pill 6 times a day, 3 hourly, starting at 7 AM. I will order a sleep study to look for sleep apnea and for your dream enactment behavior during sleep.

## 2024-04-01 NOTE — Progress Notes (Signed)
 Subjective:    Patient ID: Paula Fuller is a 73 y.o. female.  HPI    Interim history:   Paula Fuller is a 73 year old right-handed woman with an underlying medical history of anxiety, depression, irritable bowel syndrome, history of hyperlipidemia and reflux disease, who presents for follow-up consultation of her right sided parkinsonism, complicated by anxiety, sleep disturbance including dream enactment behavior, weight fluctuation and GI symptoms. The patient is unaccompanied today. I last saw her in December 2024, at which time she reported doing fairly well.  She reported that she was sleeping fairly well.  She had some sleep talking.  She was no longer on clonazepam  or Prozac  or Wellbutrin .  She had also stopped taking BuSpar  in the past.  She was advised to continue with levodopa  IR 1 pill 5 times a day.  I maintained her on Flexeril  as needed at bedtime.  Today, 04/01/2024: She reports that her sleep disturbance has become worse.  She has nearly nightly difficulty with vivid dreams and at least 3-4 nights out of the week she mumbles in her sleep and thrashes.  She has thankfully not fallen out of bed.  Her husband is disturbed by her sleep and she herself is also tired during the day.  Her Epworth sleepiness score is 4 out of 24, fatigue severity score is 43 out of 63.  Because of her daytime tiredness and fatigue she does not have much energy to pursue physical activity.  She has had some weight fluctuation.  She has tried melatonin in the past, no longer is on clonazepam  but would be willing to restart medications.  She has not had any major cognitive changes.  She has not fallen.  She takes her Sinemet  5 times a day, sometimes when she takes the last dose deliberately later in the evening she feels that her dream enactment is a little less.  She takes Flexeril  as needed.  She would like to avoid a sleep study but is willing to get the approval process started.  The patient's allergies, current  medications, family history, past medical history, past social history, past surgical history and problem list were reviewed and updated as appropriate.    Previously:  06/26/2023: She reports feeling fairly stable.  We had talked about doing a home sleep test last time but she does not feel the need for it now.  She is sleeping fairly well.  She does have some sleep talking to her husband's feedback but does not have any dramatic movements or dream enactment behaviors currently.  She feels that that has actually improved.  She is no longer on Wellbutrin , she felt like she was foggy headed, and, she stopped it about 2 months ago without repercussions, actually feels better without it.  She no longer takes clonazepam  or Prozac  either.  Sometimes her anxiety flares up but she is trying to make do with very little medications as she does not tolerate medicines very well.  She had her knee about 2 to 3 months ago and had mildly elevated cholesterol numbers but her good cholesterol was fairly high and no medication was suggested at the time.  Has had her balance is not as good.  Especially when she has been for a while and has to suddenly get up she may feel lightheaded and wobbly.  She has not fallen thankfully.  Constipation is a problem.  She is trying to be active, is taking care of her household and works as a Lawyer about 3  times a month.  Nevertheless, she does not exercise in any regular pattern and feels that she has gained some weight.  Her tremor fluctuates.  She takes her Sinemet  regularly 5 times a day and it does last her about 3 hours, she can tell when she is due for her next dose typically.  She starts her first dose at 7 AM.    I saw her on 01/23/2023, at which time she was not sleeping well.  She endorsed restlessness and tossing and turning.  She was having a dream enactment behavior.  She tried melatonin but she had a headache from it.  She was on BuSpar  for anxiety.  She had  residual anxiety and I suggested we start her on Wellbutrin  and stop taking BuSpar  gradually.  We considered trazodone for future use and I considered a home sleep test.       I saw her on 08/16/2022, at which time she reported doing quite well, she felt stable.  She had stopped taking the Sinemet  CR as she had nervousness from it.  She was taking Sinemet  IR 5 times a day and Prozac  in the morning, clonazepam  at bedtime.  She was utilizing the BuSpar  for anxiety as needed.  She had recent right eye cataract surgery and was supposed to have the left eye done in mid February 2024.       I saw her on 01/31/2022, at which time she reported sleeping better and she reported doing better.  She was taking clonazepam  2 mg at bedtime which was helping her vivid dreams and dream enactment.  She felt that she did not need the BuSpar  any longer.  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  She was offered a full-time position but declined at.  She was tolerating low-dose Prozac .  She was on Sinemet  CR at bedtime and Sinemet  IR 5 times a day at 3-hour intervals.  She was taking a probiotic daily.  She was advised to continue with her medication regimen.     I saw her on 08/02/2021, at which time she reported doing well overall but still had some difficulty maintaining sleep and intermittent issues with sleep talking and dream enactment behavior per husband's feedback.  Mood wise she felt a lot better compared to few months prior.  I suggested adding a small dose of clonazepam  0.25 mg in the middle of the night, preferably no later than 3:30 AM.  She was also advised to add half of a dose if she were to wake up more closer to 4 AM.  She was advised to continue with her clonazepam  1 mg at bedtime, Sinemet  CR at bedtime, and Sinemet  IR 1 pill 5 times a day.  She was advised to continue with Flexeril  10 mg at bedtime as needed, continue with generic Prozac  20 mg daily and  BuSpar  10 mg in the morning and 20 mg in the afternoon.     I saw her on 03/23/21, at which time she reported doing fairly well motor wise.  She had residual anxiety.  We mutually agreed to increase her BuSpar .  We also restarted her Prozac .  She was advised to continue with Sinemet  and Sinemet  CR.  She was using melatonin at night.  She had occasional neck stiffness and shoulder girdle stiffness and had tried Flexeril  10 mg strength from her husband.  I did prescribe Flexeril  for her for as needed use.  She was advised not to  use her husband's medication though.  She was active physically.     I saw her on 09/20/20, at which time she reported feeling better.  She had been more consistent with her medications.  She did have residual anxiety especially prior to bedtime.  She was on BuSpar  5 mg 3 times daily consistently.  She was on clonazepam  at night and melatonin 5 mg at night.  She was encouraged to try to increase the melatonin to up to 10 mg at night.  She was also advised to increase her evening BuSpar  dose to 10 mg and keep the morning and midday doses at 5 mg.  She was advised to continue with the Sinemet  CR at night and Sinemet  regular release 1 pill 5 times a day.  She found that the increase to the 5 times a day schedule for her IR Sinemet  was helpful.     I saw her on 05/07/20, at which time she reported that she had worsening dream enactment behavior.  Of note, she tapered herself off her medications but had restarted the clonazepam  and the Sinemet  CR.  She was advised to be consistent with her medication, she was advised to start BuSpar , she was off of Prozac  and she was advised to be consistent with clonazepam .  We increased her Sinemet  IR to 1 pill 5 times a day at the time.     I saw her on 11/19/19, at which time she reported that her mood was better.  Prozac  was helping, she was on 10 mg strength.  BuSpar  was helping for situational anxiety and she was taking it infrequently.  She was on  Sinemet  4 times a day.  She reported that she was having a harder time getting through the night with her motor symptoms flaring up in the middle of the night.  She was advised to continue with all her medications but add Sinemet  CR at bedtime.  We talked about adding Comtan next.  We also talked about the possibility of just moving her Sinemet  IR closer together to add a 5th dose to her regimen.  She emailed recently in September 2021 reporting that she had stopped most of her medications for concern for side effects. She reported having more dream enactment behavior as witnessed by her husband.  She thought that these became worse in the previous 3 months and therefore tapered herself off of most of her meds. She had stopped the Sinemet  CR, Prozac , clonazepam .  She was advised to start back on the Sinemet  CR and clonazepam  and continue with Sinemet  IR.   I saw her on 08/21/2019, at which time she reported significant increase in her stress.  She was unable to tolerate Celexa  in the recent past and had also tried Lexapro.  In the distant past she had tried Prozac  and we mutually agreed to try low-dose Prozac  and for anxiety I suggested BuSpar  and low-dose with gradual increase, as needed.  She was advised to continue with her Sinemet .  She declined a referral to psychiatry.  She also declined a referral for a second opinion for her Parkinson's disease.      I saw her on 05/21/2019, at which time she felt improvement with the Sinemet .  She was tolerating it okay.  She had improvement in her headaches after she stopped the Neupro  patch.  She had significant issues with anxiety and also depression.  She had weight gain in the past with sertraline.  I suggested a trial of Celexa .  I also suggested she increase the Sinemet  to 4 times a day.     She emailed in the interim in early December 2020 indicating that the Sinemet  4 times a day felt too much, she felt dizzy.  She was advised to reduce it back to 3 times  daily.  She also reported side effects from the Celexa  including nausea and vomiting and appetite loss and was therefore advised to stop it.         I saw her on 02/17/2019, at which time she reported no significant results from the Neupro , had ongoing issues with weight gain and swelling and also felt that she had developed some headaches after starting the Neupro  patch.  She reported dream enactment behaviors.  I suggested she stop the Neupro  and start a trial of Sinemet .  In addition, she was advised to start clonazepam  at night.  She called in the interim reporting that her PCP did not want her on both Xanax and clonazepam  and we have discussed this as well.  She was advised to discuss this matter with her primary care physician and see if she could go without the Xanax during the day and continue with the clonazepam  at night.    I saw her on 11/21/2018 in a virtual visit, at which time she reported side effects with Mirapex  including weight gain and not sleeping well at night.  I suggested we switch her over to the Neupro  patch, 2 mg strength.   I saw her on 06/26/2018, At which time she reported feeling better, she reported that the Mirapex  was helpful.  She had stopped a new antidepressant and started generic Celexa .  Her anxiety was better.  She was able to tolerate the Mirapex .   I saw her on 03/27/2018, at which time she reported still struggling with her anxiety and depressive symptoms. We talked about her DaT scan results. She had tried multiple different antidepressants in the past, she was given most recently a sample for Trintellix by her PCP but had not started it. She was advised to start it.   She was also advised to start Mirapex  with gradual increase. She called in the interim in November 2019 reporting that Mirapex  was not making a big difference. She was advised that she was on a low-dose of advised to increase it. She had also called in September 2019 reporting that she was not able to  tolerate the antidepressant that was given to her by her PCP. She was advised to talk to her family doctor about alternative treatment options for anxiety and depression. She had reduced her Xanax.     I first met her on 12/13/2017 at the request of her primary care provider, at which time she reported a bilateral hand tremor of at least 2 years duration as well as fine motor dyscontrol. Right-sided symptoms were worse on the left. Her examination was concerning for parkinsonism with right-sided lateralization. I suggested we proceed with a brain MRI and we subsequently also proceeded with a DaT scan.  She had a brain MRI with and without contrast on 12/27/2017 and I reviewed the results: IMPRESSION: This MRI of the brain with and without contrast shows the following: 1.    There are some scattered T2/FLAIR hyperintense foci consistent with mild age-related chronic microvascular ischemic changes 2.    There are no acute findings and there is a normal enhancement pattern.   We called her with her test results.    She had a  nuclear medicine DaT scan on 03/21/2018 and a very tight results:  IMPRESSION: Near absent radiotracer activity within the bilateral posterior striata (putamen) in a pattern typical of a Parkinson's syndrome. pathology. There is mild decreased relative activity in the head of the LEFT caudate nucleus compared to the RIGHT.   We called her with her test results and scheduled her for a sooner than scheduled appointment.   12/13/2017: (She) reports a right more than left hand tremor for the past 2 years, worse in the past few months. She has noticed other issues including fine motor dyscontrol, balance issues, mild short-term memory loss. 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 She has never considered seeing ENT for this. She has suboptimally controlled anxiety. She has been on Lexapro in the past which caused significant GI related side effects and for the  past 2 months she has been on sertraline. She does not feel that the sertraline has been helpful. She has been on Xanax off and on since the late 90s when she had to take care of her father. She has been on Xanax consistently 3 times a day for the past couple of years, since 2016 or so. She is currently on sertraline 100 mg daily and alprazolam 0.5 mg 3 times a day. She has had significant weight loss and had multiple tests for this including endoscopy and CAT scan all per her report without significant abnormality is thankfully. She is using some protein milkshakes to supplement her diet. Her tremor is at rest as well as with activity at times. It is more noticeable on the right, she has also noticed difficulty with fine motor tasks also more on the right. She fell recently once in the yard, as she was standing up from the squatting position and thankfully did not hurt herself.I reviewed your office note from 10/29/2017. She has no family history of tremors but reports a family history of Parkinson's disease, her father's first cousin apparently had Parkinson's disease, another first cousin of her father had some other neurological disease, maybe MS. She has worried about MS. She saw a neurologist in [place], [place]  in August 2018 told her she does not have MS. She is a nonsmoker and does not drink alcohol and drinks caffeine one serving per day on average. She is married and lives with her husband, they have 1 daughter. 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   Her Past Medical History Is Significant For: Past Medical History:  Diagnosis Date   Anxiety disorder    GERD (gastroesophageal reflux disease)    High cholesterol    IBS (irritable bowel syndrome)    Insomnia     Her Past Surgical History Is Significant For: Past Surgical History:  Procedure Laterality Date   CATARACT EXTRACTION Right    CATARACT EXTRACTION Left 08/02/2022    CHOLECYSTECTOMY      Her Family History Is Significant For: Family History  Problem Relation Age of Onset   Parkinson's disease Paternal Uncle    Parkinson's disease Paternal Uncle    Tremor Neg Hx     Her Social History Is Significant For: Social History   Socioeconomic History   Marital status: Married    Spouse name: Not on file   Number of children: Not on file   Years of education: Not on file   Highest education level: Not on file  Occupational History  Not on file  Tobacco Use   Smoking status: Never   Smokeless tobacco: Never  Vaping Use   Vaping status: Never Used  Substance and Sexual Activity   Alcohol use: Never   Drug use: Never   Sexual activity: Not on file  Other Topics Concern   Not on file  Social History Narrative   Right handed   Social Drivers of Health   Financial Resource Strain: Not on file  Food Insecurity: Not on file  Transportation Needs: Not on file  Physical Activity: Not on file  Stress: Not on file  Social Connections: Not on file    Her Allergies Are:  Allergies  Allergen Reactions   Other     All anti-anxiety medications we've tried have upset my stomach (wellbutrin , prozac , xanax)   Prozac  [Fluoxetine ] Other (See Comments)    Upset Stomach  :   Her Current Medications Are:  Outpatient Encounter Medications as of 04/01/2024  Medication Sig   carbidopa -levodopa  (SINEMET  IR) 25-100 MG tablet Take 1 tablet by mouth 5 (five) times daily. Take at 7, 10, 1 PM, 4 PM, and 7 PM.   cyclobenzaprine  (FLEXERIL ) 10 MG tablet TAKE 1 TABLET BY MOUTH AT BEDTIME AS NEEDED FOR MUSCLE SPASMS.   Multiple Vitamin (MULTIVITAMIN PO) Take by mouth.   VITAMIN D PO Take by mouth.   clonazePAM  (KLONOPIN ) 1 MG disintegrating tablet Take 2 tablets (2 mg total) by mouth at bedtime. (Patient not taking: Reported on 04/01/2024)   No facility-administered encounter medications on file as of 04/01/2024.  :  Review of Systems:  Out of a complete 14  point review of systems, all are reviewed and negative with the exception of these symptoms as listed below:  Review of Systems  Neurological:        Patient is here alone for PD follow-up to discuss medication. She feels she needs more medication at nighttime. She has night terrors.     Objective:  Neurological Exam  Physical Exam Physical Examination:   Vitals:   04/01/24 0742  BP: 127/83  Pulse: 84    General Examination: The patient is a very pleasant 73 y.o. female in no acute distress. She appears well-developed and well-nourished and well groomed.   HEENT: Normocephalic, atraumatic, pupils are equal, round and reactive to light, has corrective eyeglasses.  Extraocular tracking is fairly well-preserved, moderate facial masking is noted. No significant lip or jaw tremor is noted today. Airway examination is stable, moderate airway crowding and mild mouth dryness noted.  Tongue protrudes centrally and palate elevates symmetrically.  No carotid bruits.    Chest: Clear to auscultation without wheezing, rhonchi or crackles noted.   Heart: S1+S2+0, regular and normal without murmurs, rubs or gallops noted.    Abdomen: Soft, non-tender and non-distended.   Extremities: There is no pitting edema in the distal lower extremities bilaterally.    Skin: Warm and dry without trophic changes noted.    Musculoskeletal: exam reveals no obvious joint deformities.    Neurologically:  Mental status: The patient is awake, alert and oriented in all 4 spheres. Her immediate and remote memory, attention, language skills and fund of knowledge are good. Mood and affect normal.  Good spirits.   Cranial nerves II - XII are as described above under HEENT exam. Motor exam: Normal bulk, and strength are noted. She has mild increase in tone in the right more than left upper extremity. There is a very mild intermittent resting tremor in the right hand.  Overall mild bradykinesia, all stable.   (On  12/13/2017: On Archimedes spiral drawing she has slight insecurity with both hands, no obvious tremor though. Handwriting is legible, not particularly tremulous, but mildly micrographic.)    She has a slight postural tremor in both upper extremities, she has no significant action tremor, no intention tremor. On fine motor testing, she has mild to moderate difficulty on the right, better on the left, stable overall. Cerebellar testing: No dysmetria or intention tremor. There is no truncal or gait ataxia. Sensory exam: intact to light touch.  Gait, station and balance: She stands without difficulty, posture is mildly stooped for age, slight tilt to the R. She walks with a slightly decreased stride length and good pace, decreased arm swing on the right more than left.  She turns quite well, but does have some insecurity with standing and turning especially after for standing up.  She feels mildly lightheaded.  No walking aid.    Assessment and Plan:  In summary, Paula Fuller is a 73 year old female ith an underlying medical history of anxiety, depression, irritable bowel syndrome, overweight state, hyperlipidemia and reflux disease, who presents for follow-up consultation of her right-sided predominant Parkinson's disease, complicated by sleep disturbance including dream enactment behavior, anxiety, and weight fluctuations, with motor symptoms dating back to about 5 years ago.     Her DaT scan in 09/19 supported the diagnosis.  Her brain MRI in 06/19 showed age-appropriate findings.  She started treatment with Mirapex  in 2019, low-dose and initially did quite well, noticed an improvement in her tremor and her exam had improved.  However, she was having more trouble with taking 0.25 mg strength 2 pills 3 times daily. She also gained weight while on it.  She was advised to discontinue Mirapex  and start Neupro  patch in May 2020.  She did not have telltale results from the Neupro , had ongoing issues with  swelling and weight gain, also headaches. We stopped the Neupro  patch and she started Sinemet  in August 2020. We increased this to 1 pill 5 times a day in October 2021.  She has had some dizziness from it, but overall tolerates it and has done well with it.  She has been on clonazepam  0.5 mg each night since October 2020. She had been on Xanax before but no longer takes it.  She has struggled with anxiety and depression for years.  She has been on several different medications, could not tolerate Celexa  which we tried, and has been on low dose Prozac  since February 2021, but tapered off of it due to GI side effects. She started having increased dream enactment behavior.  She tried melatonin for this but it did not help and she had headaches from the melatonin.  For increased anxiety, we increased BuSpar  to 5 mg 3 times daily in October 2021.  She has had some residual evening anxiety, particularly prior to bedtime.  She was encouraged to increase the BuSpar  to 10 mg at bedtime in March 2022. We restarted Prozac  at 20 mg strength in 09/22. She started Flexeril  10 mg at bedtime as needed in September 2022 for muscle cramps due to restlessness, but did not take it consistently.  She also stopped taking the BuSpar  sometime earlier in 2023 due to less stress and anxiety.  She had some nausea and even vomiting from BuSpar  in the afternoon but has it still at home for as needed use and would like to have it as a backup for anxiety flare  to be used once daily.  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  We increased her clonazepam  to 1 mg at bedtime in October 2021 and she increase this further to 2 mg at bedtime on her own in or around May or June 2023.  She is no longer on clonazepam .    We started the Sinemet  CR at bedtime in May 2021 but she stopped it in September 2023, she felt that it made her more hyper.    She is currently on Sinemet  IR 1 pill 5 times a  day. We started Wellbutrin  low-dose for anxiety in July 2024 but she stopped it in or around September 2024 due to side effects.    I will renew her prescription for Flexeril  as needed at bedtime.   For her sleep disturbance we had considered a home sleep test in July 2024 but she wanted to hold off on it.   Below is a summary of my recommendations and our discussion points from today's visit, based on chart review, history and examination. They were given these instructions verbally during the visit in detail and also in writing in the MyChart after visit summary (AVS), which they can access electronically. << Start Melatonin: 10 mg each night at bedtime, may gradually go up to 15 mg or even 20 mg if needed.  We will restart you on Klonopin  (generic: clonazepam ): 0.5 mg, take one pill each bedtime. Common side effects include sedation or sleepiness, balance problem, rare: personality changes.  Increase your levodopa  to 1 pill 6 times a day, 3 hourly, starting at 7 AM. I will order a sleep study to look for sleep apnea and for your dream enactment behavior during sleep. >>     She is advised to follow-up routinely in this clinic in 6 to 8 months to see one of our nurse practitioners.     I answered all her questions today and she was in agreement.   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 Pertinent laboratory and imaging test results that were available during this visit with the patient were reviewed by me and considered in my medical decision making (see chart for details).

## 2024-04-09 ENCOUNTER — Telehealth: Payer: Self-pay | Admitting: Neurology

## 2024-04-09 NOTE — Telephone Encounter (Signed)
 LVM for pt to call back to schedule   Mylene barrows: [id number] (exp. 04/03/24 to 07/02/24)

## 2024-07-20 ENCOUNTER — Other Ambulatory Visit: Payer: Self-pay | Admitting: Neurology

## 2024-07-20 DIAGNOSIS — G20A2 Parkinson's disease without dyskinesia, with fluctuations: Secondary | ICD-10-CM

## 2024-07-20 DIAGNOSIS — F419 Anxiety disorder, unspecified: Secondary | ICD-10-CM

## 2024-07-20 DIAGNOSIS — G4752 REM sleep behavior disorder: Secondary | ICD-10-CM

## 2024-07-20 DIAGNOSIS — M62838 Other muscle spasm: Secondary | ICD-10-CM

## 2024-07-20 DIAGNOSIS — G479 Sleep disorder, unspecified: Secondary | ICD-10-CM

## 2024-07-25 NOTE — Telephone Encounter (Signed)
 Last seen on 04/01/24 Follow up scheduled 11/04/24

## 2024-11-04 ENCOUNTER — Ambulatory Visit: Admitting: Family Medicine
